# Patient Record
Sex: Female | Born: 1945 | Race: White | Hispanic: No | Smoking: Never smoker
Health system: Southern US, Community
[De-identification: ages and names within clinical notes are randomized; demographics above are authoritative.]

## PROBLEM LIST (undated history)

## (undated) DIAGNOSIS — F329 Major depressive disorder, single episode, unspecified: Secondary | ICD-10-CM

## (undated) DIAGNOSIS — I4719 Other supraventricular tachycardia: Secondary | ICD-10-CM

## (undated) DIAGNOSIS — I471 Supraventricular tachycardia, unspecified: Secondary | ICD-10-CM

## (undated) DIAGNOSIS — E782 Mixed hyperlipidemia: Secondary | ICD-10-CM

## (undated) DIAGNOSIS — E039 Hypothyroidism, unspecified: Secondary | ICD-10-CM

## (undated) DIAGNOSIS — I4891 Unspecified atrial fibrillation: Secondary | ICD-10-CM

## (undated) DIAGNOSIS — F32A Depression, unspecified: Secondary | ICD-10-CM

## (undated) DIAGNOSIS — C50919 Malignant neoplasm of unspecified site of unspecified female breast: Secondary | ICD-10-CM

## (undated) DIAGNOSIS — I10 Essential (primary) hypertension: Secondary | ICD-10-CM

## (undated) DIAGNOSIS — E119 Type 2 diabetes mellitus without complications: Secondary | ICD-10-CM

## (undated) HISTORY — DX: Supraventricular tachycardia, unspecified: I47.10

## (undated) HISTORY — DX: Essential (primary) hypertension: I10

## (undated) HISTORY — DX: Depression, unspecified: F32.A

## (undated) HISTORY — DX: Supraventricular tachycardia: I47.1

## (undated) HISTORY — DX: Hypothyroidism, unspecified: E03.9

## (undated) HISTORY — DX: Major depressive disorder, single episode, unspecified: F32.9

## (undated) HISTORY — DX: Unspecified atrial fibrillation: I48.91

## (undated) HISTORY — PX: TONSILLECTOMY: SUR1361

## (undated) HISTORY — DX: Other supraventricular tachycardia: I47.19

## (undated) HISTORY — DX: Mixed hyperlipidemia: E78.2

## (undated) HISTORY — DX: Malignant neoplasm of unspecified site of unspecified female breast: C50.919

---

## 2004-12-09 HISTORY — PX: OTHER SURGICAL HISTORY: SHX169

## 2005-10-11 ENCOUNTER — Ambulatory Visit: Admission: RE | Admit: 2005-10-11 | Discharge: 2005-10-24 | Payer: Self-pay | Admitting: *Deleted

## 2005-11-26 ENCOUNTER — Ambulatory Visit: Admission: RE | Admit: 2005-11-26 | Discharge: 2005-12-05 | Payer: Self-pay | Admitting: *Deleted

## 2009-10-26 ENCOUNTER — Ambulatory Visit (HOSPITAL_COMMUNITY): Admission: RE | Admit: 2009-10-26 | Discharge: 2009-10-26 | Payer: Self-pay | Admitting: Ophthalmology

## 2009-11-23 ENCOUNTER — Ambulatory Visit (HOSPITAL_COMMUNITY): Admission: RE | Admit: 2009-11-23 | Discharge: 2009-11-23 | Payer: Self-pay | Admitting: Ophthalmology

## 2010-04-19 ENCOUNTER — Ambulatory Visit: Payer: Self-pay | Admitting: Cardiology

## 2011-03-13 LAB — BASIC METABOLIC PANEL
CO2: 31 mEq/L (ref 19–32)
Chloride: 104 mEq/L (ref 96–112)
GFR calc Af Amer: >60 mL/min (ref >60)
Potassium: 3.6 mEq/L (ref 3.5–5.1)
Sodium: 142 mEq/L (ref 135–145)

## 2011-03-13 LAB — HEMOGLOBIN AND HEMATOCRIT, BLOOD
HCT: 34.3 % — ABNORMAL LOW (ref 36.0–46.0)
Hemoglobin: 11.8 g/dL — ABNORMAL LOW (ref 12.0–15.0)

## 2011-04-28 DIAGNOSIS — I471 Supraventricular tachycardia, unspecified: Secondary | ICD-10-CM

## 2011-04-28 DIAGNOSIS — R000 Tachycardia, unspecified: Secondary | ICD-10-CM

## 2011-04-29 DIAGNOSIS — I214 Non-ST elevation (NSTEMI) myocardial infarction: Secondary | ICD-10-CM

## 2011-04-29 DIAGNOSIS — I471 Supraventricular tachycardia: Secondary | ICD-10-CM

## 2011-04-29 DIAGNOSIS — I517 Cardiomegaly: Secondary | ICD-10-CM

## 2011-04-30 DIAGNOSIS — I4891 Unspecified atrial fibrillation: Secondary | ICD-10-CM

## 2011-05-01 ENCOUNTER — Encounter: Payer: Self-pay | Admitting: Cardiology

## 2011-05-02 ENCOUNTER — Encounter: Payer: Self-pay | Admitting: Internal Medicine

## 2011-05-02 ENCOUNTER — Ambulatory Visit (INDEPENDENT_AMBULATORY_CARE_PROVIDER_SITE_OTHER): Payer: Medicare Other | Admitting: Internal Medicine

## 2011-05-02 DIAGNOSIS — I471 Supraventricular tachycardia: Secondary | ICD-10-CM

## 2011-05-02 DIAGNOSIS — Z01810 Encounter for preprocedural cardiovascular examination: Secondary | ICD-10-CM

## 2011-05-02 DIAGNOSIS — R0602 Shortness of breath: Secondary | ICD-10-CM

## 2011-05-02 DIAGNOSIS — I498 Other specified cardiac arrhythmias: Secondary | ICD-10-CM

## 2011-05-02 LAB — PROTIME-INR

## 2011-05-02 NOTE — Assessment & Plan Note (Signed)
Diane Woods is a pleasant 65 yo WF with a h/o longstanding intermittent symptomatic SVT.  She has failed medical therapy with atenolol.  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  Therapeutic strategies for supraventricular tachycardia including medicine and ablation were discussed in detail with the patient today. Risk, benefits, and alternatives to EP study and radiofrequency ablation were also discussed in detail today. These risks include but are not limited to stroke, bleeding, vascular damage, tamponade, perforation, damage to the heart and other structures, AV block requiring pacemaker, worsening renal function, and death. The patient understands these risk and wishes to proceed.  We will therefore proceed with catheter ablation next week at the next available time.

## 2011-05-02 NOTE — Progress Notes (Signed)
Diane Woods is a pleasant 65 y.o. WF patient with a h/o supraventricular tachycardia who presents today for EP consultation.  She reports initially developing abrupt onset and abrupt offset of tachypalpitations more than 25 years ago.  She reports associated fatigue and decreased exercise tolerance.  She denies CP, SOB, presyncope, or syncope.  She feels that stress is the only identifiable trigger.  She states that episodes would initially occur every few years, lasting several minutes to several hours at a time. She has previously been able to terminate tachycardia with cough but has not found other vagal maneuvers to be helpful. Episodes have increased in frequency and duration since that time.  She reports having two episodes of tachycardia over the past few weeks.  The most recent episode occurred 04/27/11 lasting about 7 hours.  She presented to [facility] where she was documented to have a short RP narrow complex tachycardia at 155 bpm.  She received diltiazem without relief and subsequently amiodarone.  I do not see in the record that adenosine was administered.  Her tachycardia eventually terminated.  She had previously been taking atenolol for prevention.  This was stopped and she was placed on diltiazem 120mg  daily with instructions to be evaluated by EP for possible ablation.  Today, she denies symptoms of  chest pain, shortness of breath, orthopnea, PND, lower extremity edema, dizziness, presyncope, syncope, or neurologic sequela. The patient is tolerating medications without difficulties and is otherwise without complaint today.   Past Medical History  Diagnosis Date  . SVT (supraventricular tachycardia)   . Unspecified hypothyroidism     on Synthroid  . Hyperlipidemia   . Hypertension   . Personal history of malignant neoplasm of breast   . Depression     On Wellbutrin   Past Surgical History  Procedure Date  . Tonsillectomy   . Right breast lumpectomy 2006    Current  Outpatient Prescriptions  Medication Sig Dispense Refill  . ALPRAZolam (XANAX) 0.5 MG tablet Take 0.5 mg by mouth at bedtime as needed.        Marland Kitchen aspirin 81 MG tablet Take 81 mg by mouth daily.        Marland Kitchen azelastine (ASTELIN) 137 MCG/SPRAY nasal spray 1 spray by Nasal route 2 (two) times daily. Use in each nostril as directed       . buPROPion (WELLBUTRIN XL) 150 MG 24 hr tablet Take 150 mg by mouth daily.        . diclofenac (VOLTAREN) 75 MG EC tablet Take 75 mg by mouth 2 (two) times daily.        . fenofibrate (TRICOR) 145 MG tablet Take 145 mg by mouth daily.        . fish oil-omega-3 fatty acids 1000 MG capsule Take 1,200 mg by mouth daily. Take 4 tablets by mouth daily.       Marland Kitchen FLUoxetine (PROZAC) 40 MG capsule Take 40 mg by mouth daily.        Marland Kitchen levothyroxine (SYNTHROID, LEVOTHROID) 88 MCG tablet Take 88 mcg by mouth daily.        Marland Kitchen loratadine (CLARITIN) 10 MG tablet Take 10 mg by mouth daily.        . metFORMIN (GLUCOPHAGE) 500 MG tablet Take 500 mg by mouth. Take 1 tablet by mouth every day.      . Multiple Vitamins-Minerals (MULTI COMPLETE PO) Take by mouth.        . oxyCODONE-acetaminophen (PERCOCET) 10-325 MG per tablet Take 1 tablet  by mouth every 4 (four) hours as needed.        . potassium chloride (K-DUR) 10 MEQ tablet Take 20 mEq by mouth 2 (two) times daily.        . rosuvastatin (CRESTOR) 10 MG tablet Take 10 mg by mouth daily.        Marland Kitchen zolpidem (AMBIEN) 10 MG tablet Take 10 mg by mouth at bedtime as needed.        Marland Kitchen azithromycin (ZITHROMAX) 250 MG tablet Take 2 tablets by mouth on day 1, followed by 1 tablet by mouth daily for 4 days.      Marland Kitchen diltiazem (CARDIZEM CD) 120 MG 24 hr capsule       . potassium chloride SA (K-DUR,KLOR-CON) 20 MEQ tablet         Allergies  Allergen Reactions  . Penicillins Swelling    THROAT SWELLING  . Sulfa Antibiotics Rash    History   Social History  . Marital Status: Married    Spouse Name: N/A    Number of Children: N/A  . Years of  Education: N/A   Occupational History  . Not on file.   Social History Main Topics  . Smoking status: Never Smoker   . Smokeless tobacco: Not on file  . Alcohol Use: No  . Drug Use: No  . Sexually Active: Not on file   Other Topics Concern  . Not on file   Social History Narrative   Pt lives in [place].  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     Family History  Problem Relation Age of Onset  . Coronary artery disease      REMOTE FAMILY HISTORY OF EARLY    ROS- All systems are reviewed and negative except as per the HPI above  Physical Exam: Filed Vitals:   05/02/11 1019  BP: 117/73  Pulse: 64  Resp: 20  Height: 5\' 2"  (1.575 m)  Weight: 159 lb 12.8 oz (72.485 kg)  SpO2: 98%    GEN- The patient is well appearing, alert and oriented x 3 today.   Head- normocephalic, atraumatic Eyes-  Sclera clear, conjunctiva pink Ears- hearing intact Oropharynx- clear Neck- supple, no JVP Lymph- no cervical lymphadenopathy Lungs- Clear to ausculation bilaterally, normal work of breathing Heart- Regular rate and rhythm, no murmurs, rubs or gallops, PMI not laterally displaced GI- soft, NT, ND, + BS Extremities- no clubbing, cyanosis, or edema MS- no significant deformity or atrophy Skin- no rash or lesion Psych- euthymic mood, full affect Neuro- strength and sensation are intact  EKG from 04/27/11 reveals a narrow complex SVT at 155 bpm with pseudo RsR' in V1 and pseudo S wave in II EKG from 04/28/11 reveals sinus bradycardia 57 bpm, PR 130, QRS 92, Qtc 431, LAD, otherwise normal ekg  Echo reveals preserved EF with mild pulmonary hypertension  Assessment and Plan:

## 2011-05-02 NOTE — Patient Instructions (Signed)
   Please arrive to short stay at [facility] on Thursday, May 31 at 1:00 Your physician recommends that you go to the [facility] for lab work today Follow up to be determined.

## 2011-05-03 ENCOUNTER — Encounter: Payer: Self-pay | Admitting: Internal Medicine

## 2011-05-09 ENCOUNTER — Ambulatory Visit (HOSPITAL_COMMUNITY)
Admission: RE | Admit: 2011-05-09 | Discharge: 2011-05-09 | Disposition: A | Discharge status: Home / Self Care | Payer: Medicare Other | Source: Ambulatory Visit | Attending: Internal Medicine | Admitting: Internal Medicine

## 2011-05-09 DIAGNOSIS — I471 Supraventricular tachycardia: Secondary | ICD-10-CM

## 2011-05-09 DIAGNOSIS — I4891 Unspecified atrial fibrillation: Secondary | ICD-10-CM | POA: Insufficient documentation

## 2011-05-09 DIAGNOSIS — I498 Other specified cardiac arrhythmias: Secondary | ICD-10-CM | POA: Insufficient documentation

## 2011-05-09 LAB — GLUCOSE, CAPILLARY

## 2011-05-14 ENCOUNTER — Encounter (HOSPITAL_COMMUNITY): Payer: Medicare Other | Admitting: Radiology

## 2011-05-20 NOTE — Op Note (Signed)
NAME:  Diane Woods                ACCOUNT NO.:  [id number]  MEDICAL RECORD NO.:  [id number]           PATIENT TYPE:  O  LOCATION:  [facility]                         FACILITY:  [facility]  PHYSICIAN:  Hillis Range, MD       DATE OF BIRTH:  [birth year]  DATE OF PROCEDURE:  05/09/2011 DATE OF DISCHARGE:  05/09/2011                              OPERATIVE REPORT   PREPROCEDURE DIAGNOSIS:  Supraventricular tachycardia.  POSTPROCEDURE DIAGNOSIS:  Multifocal atrial tachycardia and atrial fibrillation.  PROCEDURES: 1. Comprehensive EP study. 2. Coronary sinus pacing and recording. 3. Mapping of SVT. 4. Isoproterenol infusion. 5. External cardioversion.  INTRODUCTION:  Diane Woods is a pleasant 65 year old female with symptomatic palpitations and previously documented tachycardia who presents today for EP study and radiofrequency ablation.  She reports abrupt onset of tachypalpitations for more than 25 years.  She recently presented to [facility] and was found to have a narrow complex tachycardia, which required intravenous diltiazem and subsequently amiodarone to terminate.  The tachycardia appeared to be a short RP tachycardia, which was quite regular.  She therefore presents today for EP study and radiofrequency ablation.  DESCRIPTION OF PROCEDURE:  Informed written consent was obtained and the patient was brought to the electrophysiology lab in the fasting state. She was adequately sedated with intravenous Versed as outlined in the nursing report.  The patient's right neck and groin were prepped and draped in the usual sterile fashion by the EP lab staff.  Using a percutaneous Seldinger technique, one 6-French hemostasis sheath was placed in the right internal jugular vein.  A 6-French curved Damato catheter was introduced through the right internal jugular vein and advanced into the coronary sinus for recording and pacing from this location.  Two 6-French and one 8-French  hemostasis sheaths were placed in the right common femoral vein.  Two 6-French quadripolar Josephson catheters were introduced through the right common femoral vein and advanced into the His bundle and right ventricular apex positions respectively.  The patient presented to the electrophysiology lab in normal sinus rhythm.  Her PR interval was 146 milliseconds with a QRS duration of 100 milliseconds and a QT interval of 390 milliseconds.  Her AH interval measured 54 milliseconds with an HV interval of 39 milliseconds.  Ventricular pacing was performed, which revealed midline decremental VA conduction with no arrhythmias induced.  The retrograde Wenckebach cycle length was 370 milliseconds.  Ventricular extra stimulus testing was then performed, which revealed midline decremental VA conduction with no retrograde jumps, echo beats, or arrhythmias observed.  The ventricular ERP was 500/250 milliseconds.  Atrial pacing was performed, which revealed decremental AV conduction with PR equal to, but not greater than RR.  The patient was observed to have a nonsustained one-to-one tachycardia with a cycle length of 446 milliseconds.  The VA time measured 315 milliseconds.  The tachycardia terminated very quickly with a ventricular beat.  The AV Wenckebach cycle length was confirmed to be 390 milliseconds.  Atrial extra stimulus testing was performed, which revealed decremental AV conduction with no AH jumps or echo beats.  The patient had again inducible tachycardia.  This  appeared to be the same one-to-one tachycardia with proximal-to-distal coronary sinus activation and the tachycardia cycle length now of 418 milliseconds with a VA time of 235 milliseconds. After several beats, this tachycardia spontaneously terminated with a ventricular beat.  No pacing maneuvers could be performed during tachycardia due to its brevity.  Atrial extra stimulus testing was again performed, which again confirmed no  AH jumps, echo beats, and had no tachycardias were induced.  The atrial ERP was 500/260 milliseconds. Atrial extra stimulus testing was then performed with a basic cycle length of 450 milliseconds with decremental AV conduction with no AH jumps or echo beats.  The patient again had tachycardia induced with proximal-to-distal atrial activation with a cycle length of 434 milliseconds and a VA time of 215 milliseconds.  Again after several beats, this tachycardia terminated with a ventricular beat.  Again, no pacing maneuvers could be performed during tachycardia because of its brevity.  Atrial extra stimulus testing was again performed at a basic cycle length of 500 milliseconds down to 500/270, which was the atrial ERP.  Isoproterenol was then infused at 2 mcg/min and adequate acceleration in heart rate response was observed.  Atrial pacing was performed down to a cycle length of 260 milliseconds, which was the AV Wenckebach cycle length with no evidence of PR greater than RR and no tachycardias observed.  Atrial extra stimulus testing was performed during isoproterenol infusion at 2 mcg/min with a basic cycle length of 450 milliseconds down to the atrial ERP, which was 450/230 milliseconds. Isoproterenol was decreased to 1 mcg per minute and ventricular pacing was performed, which again confirmed decremental VA conduction with ventricular pacing at 300 milliseconds.  Tachycardia was induced.  This was a proximal-to-distal tachycardia which was one-to-one with an atrial cycle length of 330 milliseconds and a VA time of 250 milliseconds. There was no wobble during this tachycardia cycle length and the tachycardia ended with a ventricular beat after several beats and due to its brevity, pacing maneuvers could not be performed during tachycardia. This was not reproducible response.  Ventricular extra stimulus testing was performed with a basic cycle length of 450 milliseconds, which revealed  midline decremental VA conduction with no retrograde jumps, echo beats, or tachycardias.  The ventricular ERP was 450/240 milliseconds.  Atrial pacing was performed during isoproterenol infusion down to a cycle length of 310 milliseconds, which was the AV Wenckebach cycle length with no arrhythmias observed.  There was no evidence of PR greater than RR.  Spontaneously, the patient then developed an initially one-to-one tachycardia with a cycle length of 290 milliseconds with proximal-to-distal coronary sinus activation and a variable VA time. The tachycardia then changed morphology such that the coronary sinus activation and the cycle length became variable.  This was felt to represent atrial tachycardia, which degenerated into initially atrial fibrillation and subsequently multifocal atrial tachycardia.  The patient had intermittent atrial fibrillation and intermittent multifocal atrial tachycardia.  Quite interestingly, the ventricular cycle length was rather regular and narrow at 157 beats per minute.  The patient was observed and 5 mg of intravenous Lopressor were administered.  With Lopressor, 2:1 AV conduction was observed.  The average cycle length of the atrial rhythm appear to be 184 milliseconds, however, atrial activations were of variable cycle lengths and variable morphologies with 2:1 AV conduction at this point.  The patient then degenerated into atrial fibrillation.  Once the tachycardia did not terminate after several minutes, the patient was successfully cardioverted to sinus rhythm with a  single 360 joules biphasic shock with cardioversion electrodes in the anterior-posterior thoracic configuration.  She remained in sinus rhythm thereafter.  Due to the fact that the patient had only a multifocal atrial tachycardia induced which was not amenable to mapping or ablation, no ablation was performed today.  She had no evidence of accessory pathways or dual AV nodal physiology.   I think the medical therapies will therefore be required long-term.  The procedure was therefore considered completed.  All catheters were removed and the sheaths were aspirated and flushed.  The sheaths were removed and hemostasis was assured.  There were no early apparent complications.  CONCLUSIONS: 1. Sinus rhythm upon presentation. 2. No evidence of accessory pathways or dual AV nodal physiology.  The     patient did have a nonsustained atrial tachycardia, however, this     was a long RP tachycardia and not consistent with her clinical     arrhythmia. 3. With isoproterenol infusion, the patient had multifocal atrial     tachycardia, which degenerated into atrial fibrillation.  During     her multifocal atrial tachycardia, the RR intervals were quite     regular and were felt to be more consistent with a clinical     arrhythmia.  It is felt that she therefore has multifocal atrial     tachycardia as well as atrial fibrillation as her underlying     clinical process.  The arrhythmia was felt to not be amenable to     catheter ablation today and therefore no ablation was performed. 4. Successful cardioversion to sinus rhythm. 5. Early apparent complications.  PLAN:  The patient will be started on atenolol 50 mg daily as this arrhythmia was induced with isoproterenol, and I will discontinue diltiazem.  I will place the patient on flecainide 50 mg twice daily and bring her back in several weeks for a GXT Myoview to exclude flecainide- induced arrhythmias or ischemia.     Hillis Range, MD     JA/MEDQ  D:  05/09/2011  T:  05/10/2011  Job:  [id number]  cc:   Jonelle Sidle, MD  Electronically Signed by Hillis Range MD on 05/20/2011 09:19:37 AM

## 2011-05-30 ENCOUNTER — Ambulatory Visit (HOSPITAL_COMMUNITY): Payer: Medicare Other | Attending: Internal Medicine | Admitting: Radiology

## 2011-05-30 DIAGNOSIS — I498 Other specified cardiac arrhythmias: Secondary | ICD-10-CM | POA: Insufficient documentation

## 2011-05-30 DIAGNOSIS — R0989 Other specified symptoms and signs involving the circulatory and respiratory systems: Secondary | ICD-10-CM

## 2011-05-30 DIAGNOSIS — E119 Type 2 diabetes mellitus without complications: Secondary | ICD-10-CM | POA: Insufficient documentation

## 2011-05-30 DIAGNOSIS — I4891 Unspecified atrial fibrillation: Secondary | ICD-10-CM

## 2011-05-30 MED ORDER — REGADENOSON 0.4 MG/5ML IV SOLN
0.4000 mg | Freq: Once | INTRAVENOUS | Status: AC
  Administered 2011-05-30: 0.4 mg via INTRAVENOUS

## 2011-05-30 MED ORDER — TECHNETIUM TC 99M TETROFOSMIN IV KIT
11.0000 | PACK | Freq: Once | INTRAVENOUS | Status: AC | PRN
  Administered 2011-05-30: 11 via INTRAVENOUS

## 2011-05-30 MED ORDER — TECHNETIUM TC 99M TETROFOSMIN IV KIT
33.0000 | PACK | Freq: Once | INTRAVENOUS | Status: AC | PRN
  Administered 2011-05-30: 33 via INTRAVENOUS

## 2011-05-30 NOTE — Progress Notes (Signed)
[facility] SITE 3 NUCLEAR MED [address] [place] [address] [phone]  Cardiology Nuclear Med Study  Diane Woods is a 65 y.o. female [id number] [birth year]   Nuclear Med Background Indication for Stress Test:  Evaluation for Ischemia History:  5/11 MPS([place])OK per patient, 05/10/11 Cardioversion, H/O SVT/Afib. Cardiac Risk Factors: Family History - CAD, Hypertension, Lipids and NIDDM  Symptoms:  DOE, Fatigue, Palpitations and Rapid HR   Nuclear Pre-Procedure Caffeine/Decaff Intake:  None NPO After: 12:00am   Lungs:  Clear. IV 0.9% NS with Angio Cath:  20g  IV Site: L Forearm  IV Started by:  Stanton Kidney, EMT-P  Chest Size (in):  36 Cup Size: D  Height: 5\' 2"  (1.575 m)  Weight:  151 lb (68.493 kg)  BMI:  Body mass index is 27.62 kg/(m^2). Tech Comments:  Atenolol held > 24 hours, per patient. CBG = 94 @ 7:15 am, per patient.  S/P (R) Lumpectomy with radiation.    Nuclear Med Study 1 or 2 day study: 1 day  Stress Test Type:  Treadmill/Lexiscan  Reading MD: Charlton Haws, MD  Order Authorizing Provider:  Hillis Range, MD  Resting Radionuclide: Technetium 41m Tetrofosmin  Resting Radionuclide Dose: 11 mCi   Stress Radionuclide:  Technetium 59m Tetrofosmin  Stress Radionuclide Dose: 33 mCi           Stress Protocol Rest HR: 54 Stress HR: 97  Rest BP: 135/88 Stress BP: 162/72  Exercise Time (min): 6:01 METS: 4.7   Predicted Max HR: 155 bpm % Max HR: 62.58 bpm Rate Pressure Product: 36644   Dose of Adenosine (mg):  n/a Dose of Lexiscan: 0.4 mg  Dose of Atropine (mg): n/a Dose of Dobutamine: n/a mcg/kg/min (at max HR)  Stress Test Technologist: Smiley Houseman, CMA-N  Nuclear Technologist:  Domenic Polite, CNMT     Rest Procedure:  Myocardial perfusion imaging was performed at rest 45 minutes following the intravenous administration of Technetium 24m Tetrofosmin.  Rest ECG: Sinus bradycardia.  Stress Procedure:  The patient initially  walked the treadmill for 4:00 utilizing the Bruce protocol, but was unable to obtain her target heart rate.  She was then given IV Lexiscan 0.4 mg over 15-seconds with concurrent low level exercise and then Technetium 78m Tetrofosmin was injected at 30-seconds while the patient continued walking one more minute.  There were nonspecific ST-T wave changes with Lexiscan.  Quantitative spect images were obtained after a 45-minute delay.  Stress ECG: No significant change from baseline ECG  QPS Raw Data Images:  Normal; no motion artifact; normal heart/lung ratio. Stress Images:  Normal homogeneous uptake in all areas of the myocardium. Rest Images:  Normal homogeneous uptake in all areas of the myocardium. Subtraction (SDS):  Normal Transient Ischemic Dilatation (Normal <1.22):  .96 Lung/Heart Ratio (Normal <0.45):  .34  Quantitative Gated Spect Images QGS EDV:  74 ml QGS ESV:  19 ml QGS cine images:  NL LV Function; NL Wall Motion QGS EF: 75%  Impression Exercise Capacity:  Lexiscan with low level exercise. BP Response:  Normal blood pressure response. Clinical Symptoms:  No chest pain. ECG Impression:  No significant ST segment change suggestive of ischemia. Comparison with Prior Nuclear Study: No images to compare  Overall Impression:  Normal stress nuclear study.  Charlton Haws

## 2011-05-31 NOTE — Progress Notes (Signed)
Nuclear report for review Keora Eccleston H  

## 2011-06-07 NOTE — Progress Notes (Signed)
lmom for pt

## 2011-06-14 ENCOUNTER — Encounter: Payer: Medicare Other | Admitting: Cardiology

## 2011-07-09 ENCOUNTER — Ambulatory Visit (INDEPENDENT_AMBULATORY_CARE_PROVIDER_SITE_OTHER): Payer: Medicare Other | Admitting: Cardiology

## 2011-07-09 ENCOUNTER — Encounter: Payer: Self-pay | Admitting: *Deleted

## 2011-07-09 ENCOUNTER — Encounter: Payer: Self-pay | Admitting: Cardiology

## 2011-07-09 DIAGNOSIS — E119 Type 2 diabetes mellitus without complications: Secondary | ICD-10-CM | POA: Insufficient documentation

## 2011-07-09 DIAGNOSIS — I10 Essential (primary) hypertension: Secondary | ICD-10-CM | POA: Insufficient documentation

## 2011-07-09 DIAGNOSIS — E782 Mixed hyperlipidemia: Secondary | ICD-10-CM

## 2011-07-09 DIAGNOSIS — I4891 Unspecified atrial fibrillation: Secondary | ICD-10-CM

## 2011-07-09 DIAGNOSIS — I471 Supraventricular tachycardia: Secondary | ICD-10-CM

## 2011-07-09 MED ORDER — FLECAINIDE ACETATE 50 MG PO TABS: 50.0000 mg | ORAL_TABLET | Freq: Two times a day (BID) | ORAL | Status: DC

## 2011-07-09 MED ORDER — ATENOLOL 50 MG PO TABS: 50.0000 mg | ORAL_TABLET | Freq: Every day | ORAL | Status: DC

## 2011-07-09 NOTE — Assessment & Plan Note (Signed)
Noted at EP study, degenerated from MAT. See above discussion.

## 2011-07-09 NOTE — Patient Instructions (Addendum)
Follow up as scheduled with Drs. McDowell and Allred. Your physician recommends that you continue on your current medications as directed. Please refer to the Current Medication list given to you today. Your physician has requested that you have an exercise tolerance test. For further information please visit [email or web address] Please also follow instruction sheet, as given.

## 2011-07-09 NOTE — Assessment & Plan Note (Signed)
Not specifically mentioned in her history that I unable to locate in the medical record, however she is on metformin.

## 2011-07-09 NOTE — Progress Notes (Signed)
Clinical Summary Diane Woods is a 65 y.o.female presenting for office followup. This is my first meeting with her. She has been seen in the past by Dr. Andee Lineman, and most recently by Dr. Johney Frame in the setting of recurrent SVT. EP study in June did not show evidence of dual AV nodal physiology, however she did have evidence of multifocal atrial tachycardia which degenerated into atrial fibrillation, was successfully cardioverted, and has been treated with beta blocker therapy as well as Flecainide.  She underwent a Lexiscan Myoview on June 22 which was normal.  Clinically, she reports feeling well, no significant palpitations or chest pain. I reviewed her medications. She has not yet had a followup GXT, or further review with Dr. Johney Frame.   Allergies  Allergen Reactions  . Penicillins Swelling    THROAT SWELLING  . Sulfa Antibiotics Rash    Medication list reviewed.  Past Medical History  Diagnosis Date  . Hypothyroidism   . Mixed hyperlipidemia   . Essential hypertension, benign   . Breast cancer   . Depression   . Multifocal atrial tachycardia   . Atrial fibrillation     Past Surgical History  Procedure Date  . Tonsillectomy   . Right breast lumpectomy 2006    Family History  Problem Relation Age of Onset  . Coronary artery disease      Social History Ms. Marmo reports that she has never smoked. She has never used smokeless tobacco. Ms. Melikian reports that she does not drink alcohol.  Review of Systems Reports tolerating her medications. Otherwise negative.  Physical Examination Filed Vitals:   07/09/11 0809  BP: 147/94  Pulse: 56  Resp: 18  Overweight woman in no acute distress. HEENT: Conjunctiva and lids normal, oropharynx with moist mucosa. Neck: Supple, no elevated JVP or carotid bruits. Lungs: Clear to auscultation, nonlabored. Cardiac: Regular rate and rhythm, no S3 gallop or rub. Abdomen: Soft, nontender, bowel sounds present. Skin: Warm and dry, no  unusual bruising. Extremities: No pitting edema, distal pulses full. Neuropsychiatric: Alert and oriented x3, affect appropriate.   ECG Sinus bradycardia at 59 beats per minutes with QTC 437 ms.   Problem List and Plan

## 2011-07-09 NOTE — Assessment & Plan Note (Signed)
Followed by Dr. Olena Leatherwood. Blood pressure is up today. Discussed sodium restriction.

## 2011-07-09 NOTE — Assessment & Plan Note (Signed)
Noted on EP study with Dr. Johney Frame, no clear evidence of dual AV nodal physiology, and degenerated into atrial fibrillation which was converted. She is now on flecainide, beta blocker, and aspirin. QTC is normal. Plan is to arrange a GXT on medical therapy to exclude proarrhythmia and have the patient follow up with Dr. Johney Frame. Not certain if there was a plan for Coumadin or not since her principle rhythm was not necessarily atrial fibrillation. She does have a CHADS2 score of 2 and a CHADSVASC score of 4, however.

## 2011-07-30 ENCOUNTER — Telehealth: Payer: Self-pay | Admitting: Internal Medicine

## 2011-07-30 NOTE — Telephone Encounter (Signed)
Patient never had GXT and is cx apt with Dr Johney Frame for Fri  I explained to her how important both apt's are and to try and get the GXT soon and resch apt with Allred after  I even offered to let her keep apt Fri with  Allred  and get GXT after  She refused and will  let me know if and when she reschedules

## 2011-07-30 NOTE — Telephone Encounter (Signed)
Pt called to cxl appt with allred on Friday due to not having a stress test, she said he won't want to see her without it, but dr mcdowell's office requested she be seen sooner than her original appt in sept due to her a-fib, she says she doesn't think stress test will be accurate due to problem with her legs, not sure what to do, I didn't cancel the appt yet though

## 2011-08-01 ENCOUNTER — Telehealth: Payer: Self-pay | Admitting: *Deleted

## 2011-08-01 NOTE — Telephone Encounter (Signed)
Message copied by Arlyss Gandy on Thu Aug 01, 2011  8:27 AM ------      Message from: Zachary George T      Created: Wed Jul 31, 2011  4:46 PM      Regarding: RE: gxt       Left message on patient's voice mail to return call.      Received a telephone call from Mr. Decelle stating that      Desteny Freeman is out of town on vacation. She will call the      Office when she returns from vacation      ----- Message -----         From: Cyril Loosen, RN         Sent: 07/31/2011   3:12 PM           To: Megan Salon      Subject: gxt                                                      Pt suppose to have GXT done and then f/u with Dr. Johney Frame. Pt cx'd Allred appt (need to r/s) until after GXT done. Please contact pt to reschedule.             Thanks

## 2011-08-02 ENCOUNTER — Ambulatory Visit: Payer: Medicare Other | Admitting: Internal Medicine

## 2011-08-19 ENCOUNTER — Ambulatory Visit: Payer: Medicare Other | Admitting: Internal Medicine

## 2011-09-27 ENCOUNTER — Encounter: Payer: Self-pay | Admitting: Cardiology

## 2011-10-07 ENCOUNTER — Ambulatory Visit: Payer: Medicare Other | Admitting: Cardiology

## 2012-07-27 DIAGNOSIS — C50919 Malignant neoplasm of unspecified site of unspecified female breast: Secondary | ICD-10-CM

## 2015-12-12 DIAGNOSIS — M545 Low back pain: Secondary | ICD-10-CM | POA: Diagnosis not present

## 2015-12-12 DIAGNOSIS — I10 Essential (primary) hypertension: Secondary | ICD-10-CM | POA: Diagnosis not present

## 2015-12-20 DIAGNOSIS — Z79899 Other long term (current) drug therapy: Secondary | ICD-10-CM | POA: Diagnosis not present

## 2015-12-20 DIAGNOSIS — I10 Essential (primary) hypertension: Secondary | ICD-10-CM | POA: Diagnosis not present

## 2015-12-20 DIAGNOSIS — E119 Type 2 diabetes mellitus without complications: Secondary | ICD-10-CM | POA: Diagnosis not present

## 2016-03-14 DIAGNOSIS — I10 Essential (primary) hypertension: Secondary | ICD-10-CM | POA: Diagnosis not present

## 2016-03-14 DIAGNOSIS — M545 Low back pain: Secondary | ICD-10-CM | POA: Diagnosis not present

## 2016-04-03 DIAGNOSIS — Z1211 Encounter for screening for malignant neoplasm of colon: Secondary | ICD-10-CM | POA: Diagnosis not present

## 2016-06-18 DIAGNOSIS — I10 Essential (primary) hypertension: Secondary | ICD-10-CM | POA: Diagnosis not present

## 2016-06-18 DIAGNOSIS — R5383 Other fatigue: Secondary | ICD-10-CM | POA: Diagnosis not present

## 2016-06-18 DIAGNOSIS — M545 Low back pain: Secondary | ICD-10-CM | POA: Diagnosis not present

## 2016-06-19 DIAGNOSIS — E119 Type 2 diabetes mellitus without complications: Secondary | ICD-10-CM | POA: Diagnosis not present

## 2016-06-19 DIAGNOSIS — R5383 Other fatigue: Secondary | ICD-10-CM | POA: Diagnosis not present

## 2016-06-19 DIAGNOSIS — I10 Essential (primary) hypertension: Secondary | ICD-10-CM | POA: Diagnosis not present

## 2016-09-12 DIAGNOSIS — M545 Low back pain: Secondary | ICD-10-CM | POA: Diagnosis not present

## 2016-09-12 DIAGNOSIS — Z0000 Encounter for general adult medical examination without abnormal findings: Secondary | ICD-10-CM | POA: Diagnosis not present

## 2016-09-12 DIAGNOSIS — I10 Essential (primary) hypertension: Secondary | ICD-10-CM | POA: Diagnosis not present

## 2016-11-07 DIAGNOSIS — Z1231 Encounter for screening mammogram for malignant neoplasm of breast: Secondary | ICD-10-CM | POA: Diagnosis not present

## 2016-11-07 DIAGNOSIS — R928 Other abnormal and inconclusive findings on diagnostic imaging of breast: Secondary | ICD-10-CM | POA: Diagnosis not present

## 2016-11-20 DIAGNOSIS — Z9889 Other specified postprocedural states: Secondary | ICD-10-CM | POA: Diagnosis not present

## 2016-11-20 DIAGNOSIS — R921 Mammographic calcification found on diagnostic imaging of breast: Secondary | ICD-10-CM | POA: Diagnosis not present

## 2016-12-03 DIAGNOSIS — I10 Essential (primary) hypertension: Secondary | ICD-10-CM | POA: Diagnosis not present

## 2016-12-03 DIAGNOSIS — E119 Type 2 diabetes mellitus without complications: Secondary | ICD-10-CM | POA: Diagnosis not present

## 2016-12-03 DIAGNOSIS — Z1389 Encounter for screening for other disorder: Secondary | ICD-10-CM | POA: Diagnosis not present

## 2016-12-03 DIAGNOSIS — M545 Low back pain: Secondary | ICD-10-CM | POA: Diagnosis not present

## 2016-12-03 DIAGNOSIS — Z0000 Encounter for general adult medical examination without abnormal findings: Secondary | ICD-10-CM | POA: Diagnosis not present

## 2016-12-24 DIAGNOSIS — J158 Pneumonia due to other specified bacteria: Secondary | ICD-10-CM | POA: Diagnosis not present

## 2017-02-26 DIAGNOSIS — I482 Chronic atrial fibrillation: Secondary | ICD-10-CM | POA: Diagnosis not present

## 2017-02-26 DIAGNOSIS — E038 Other specified hypothyroidism: Secondary | ICD-10-CM | POA: Diagnosis not present

## 2017-02-26 DIAGNOSIS — E784 Other hyperlipidemia: Secondary | ICD-10-CM | POA: Diagnosis not present

## 2017-02-26 DIAGNOSIS — Z0000 Encounter for general adult medical examination without abnormal findings: Secondary | ICD-10-CM | POA: Diagnosis not present

## 2017-03-17 DIAGNOSIS — E784 Other hyperlipidemia: Secondary | ICD-10-CM | POA: Diagnosis not present

## 2017-03-17 DIAGNOSIS — E039 Hypothyroidism, unspecified: Secondary | ICD-10-CM | POA: Diagnosis not present

## 2017-03-17 DIAGNOSIS — I482 Chronic atrial fibrillation: Secondary | ICD-10-CM | POA: Diagnosis not present

## 2017-03-31 DIAGNOSIS — Z780 Asymptomatic menopausal state: Secondary | ICD-10-CM | POA: Diagnosis not present

## 2017-03-31 DIAGNOSIS — M810 Age-related osteoporosis without current pathological fracture: Secondary | ICD-10-CM | POA: Diagnosis not present

## 2017-05-06 DIAGNOSIS — Z1211 Encounter for screening for malignant neoplasm of colon: Secondary | ICD-10-CM | POA: Diagnosis not present

## 2017-06-06 DIAGNOSIS — I482 Chronic atrial fibrillation: Secondary | ICD-10-CM | POA: Diagnosis not present

## 2017-06-06 DIAGNOSIS — E784 Other hyperlipidemia: Secondary | ICD-10-CM | POA: Diagnosis not present

## 2017-06-06 DIAGNOSIS — E038 Other specified hypothyroidism: Secondary | ICD-10-CM | POA: Diagnosis not present

## 2017-07-31 DIAGNOSIS — Z961 Presence of intraocular lens: Secondary | ICD-10-CM | POA: Diagnosis not present

## 2017-07-31 DIAGNOSIS — H26493 Other secondary cataract, bilateral: Secondary | ICD-10-CM | POA: Diagnosis not present

## 2017-07-31 DIAGNOSIS — E119 Type 2 diabetes mellitus without complications: Secondary | ICD-10-CM | POA: Diagnosis not present

## 2017-07-31 DIAGNOSIS — Z7984 Long term (current) use of oral hypoglycemic drugs: Secondary | ICD-10-CM | POA: Diagnosis not present

## 2017-08-07 DIAGNOSIS — Z1211 Encounter for screening for malignant neoplasm of colon: Secondary | ICD-10-CM | POA: Diagnosis not present

## 2017-08-07 DIAGNOSIS — R198 Other specified symptoms and signs involving the digestive system and abdomen: Secondary | ICD-10-CM | POA: Diagnosis not present

## 2017-09-05 DIAGNOSIS — Z0000 Encounter for general adult medical examination without abnormal findings: Secondary | ICD-10-CM | POA: Diagnosis not present

## 2017-09-05 DIAGNOSIS — I482 Chronic atrial fibrillation: Secondary | ICD-10-CM | POA: Diagnosis not present

## 2017-09-05 DIAGNOSIS — E784 Other hyperlipidemia: Secondary | ICD-10-CM | POA: Diagnosis not present

## 2017-09-05 DIAGNOSIS — Z1211 Encounter for screening for malignant neoplasm of colon: Secondary | ICD-10-CM | POA: Diagnosis not present

## 2017-09-05 DIAGNOSIS — E038 Other specified hypothyroidism: Secondary | ICD-10-CM | POA: Diagnosis not present

## 2017-09-05 DIAGNOSIS — I10 Essential (primary) hypertension: Secondary | ICD-10-CM | POA: Diagnosis not present

## 2017-09-05 DIAGNOSIS — Z1389 Encounter for screening for other disorder: Secondary | ICD-10-CM | POA: Diagnosis not present

## 2017-09-25 DIAGNOSIS — E038 Other specified hypothyroidism: Secondary | ICD-10-CM | POA: Diagnosis not present

## 2017-09-25 DIAGNOSIS — E119 Type 2 diabetes mellitus without complications: Secondary | ICD-10-CM | POA: Diagnosis not present

## 2017-09-25 DIAGNOSIS — E7849 Other hyperlipidemia: Secondary | ICD-10-CM | POA: Diagnosis not present

## 2017-10-13 DIAGNOSIS — H26492 Other secondary cataract, left eye: Secondary | ICD-10-CM | POA: Diagnosis not present

## 2017-12-04 DIAGNOSIS — I482 Chronic atrial fibrillation: Secondary | ICD-10-CM | POA: Diagnosis not present

## 2017-12-04 DIAGNOSIS — E119 Type 2 diabetes mellitus without complications: Secondary | ICD-10-CM | POA: Diagnosis not present

## 2017-12-04 DIAGNOSIS — I10 Essential (primary) hypertension: Secondary | ICD-10-CM | POA: Diagnosis not present

## 2017-12-04 DIAGNOSIS — E038 Other specified hypothyroidism: Secondary | ICD-10-CM | POA: Diagnosis not present

## 2017-12-04 DIAGNOSIS — E7849 Other hyperlipidemia: Secondary | ICD-10-CM | POA: Diagnosis not present

## 2018-03-09 DIAGNOSIS — E119 Type 2 diabetes mellitus without complications: Secondary | ICD-10-CM | POA: Diagnosis not present

## 2018-03-09 DIAGNOSIS — E7849 Other hyperlipidemia: Secondary | ICD-10-CM | POA: Diagnosis not present

## 2018-03-09 DIAGNOSIS — E038 Other specified hypothyroidism: Secondary | ICD-10-CM | POA: Diagnosis not present

## 2018-03-09 DIAGNOSIS — I10 Essential (primary) hypertension: Secondary | ICD-10-CM | POA: Diagnosis not present

## 2018-03-09 DIAGNOSIS — J301 Allergic rhinitis due to pollen: Secondary | ICD-10-CM | POA: Diagnosis not present

## 2018-06-17 DIAGNOSIS — Z0000 Encounter for general adult medical examination without abnormal findings: Secondary | ICD-10-CM | POA: Diagnosis not present

## 2018-06-17 DIAGNOSIS — E119 Type 2 diabetes mellitus without complications: Secondary | ICD-10-CM | POA: Diagnosis not present

## 2018-06-17 DIAGNOSIS — E7849 Other hyperlipidemia: Secondary | ICD-10-CM | POA: Diagnosis not present

## 2018-06-17 DIAGNOSIS — I10 Essential (primary) hypertension: Secondary | ICD-10-CM | POA: Diagnosis not present

## 2018-06-17 DIAGNOSIS — Z1389 Encounter for screening for other disorder: Secondary | ICD-10-CM | POA: Diagnosis not present

## 2018-06-17 DIAGNOSIS — E038 Other specified hypothyroidism: Secondary | ICD-10-CM | POA: Diagnosis not present

## 2018-06-26 DIAGNOSIS — Z0000 Encounter for general adult medical examination without abnormal findings: Secondary | ICD-10-CM | POA: Diagnosis not present

## 2018-06-26 DIAGNOSIS — E119 Type 2 diabetes mellitus without complications: Secondary | ICD-10-CM | POA: Diagnosis not present

## 2018-06-26 DIAGNOSIS — Z1389 Encounter for screening for other disorder: Secondary | ICD-10-CM | POA: Diagnosis not present

## 2018-06-26 DIAGNOSIS — E7849 Other hyperlipidemia: Secondary | ICD-10-CM | POA: Diagnosis not present

## 2018-08-05 DIAGNOSIS — N950 Postmenopausal bleeding: Secondary | ICD-10-CM | POA: Diagnosis not present

## 2018-08-25 DIAGNOSIS — N950 Postmenopausal bleeding: Secondary | ICD-10-CM | POA: Diagnosis not present

## 2018-09-23 DIAGNOSIS — E7849 Other hyperlipidemia: Secondary | ICD-10-CM | POA: Diagnosis not present

## 2018-09-23 DIAGNOSIS — E038 Other specified hypothyroidism: Secondary | ICD-10-CM | POA: Diagnosis not present

## 2018-09-23 DIAGNOSIS — E119 Type 2 diabetes mellitus without complications: Secondary | ICD-10-CM | POA: Diagnosis not present

## 2018-09-23 DIAGNOSIS — Z1389 Encounter for screening for other disorder: Secondary | ICD-10-CM | POA: Diagnosis not present

## 2018-09-23 DIAGNOSIS — R5381 Other malaise: Secondary | ICD-10-CM | POA: Diagnosis not present

## 2018-09-23 DIAGNOSIS — Z0000 Encounter for general adult medical examination without abnormal findings: Secondary | ICD-10-CM | POA: Diagnosis not present

## 2018-09-23 DIAGNOSIS — I10 Essential (primary) hypertension: Secondary | ICD-10-CM | POA: Diagnosis not present

## 2018-11-13 ENCOUNTER — Other Ambulatory Visit: Payer: Self-pay

## 2018-11-13 NOTE — Patient Outreach (Signed)
Diane Woods [facility]) Care Management  11/13/2018  Diane Woods [birth year] [id number]   Medication Adherence call to Diane Woods left a message for patient to call back patient is due on Atorvastatin 80 mg. Diane Woods is showing past due under [place].   [facility] Management Direct Dial [phone]  Fax [phone] [email or web address]

## 2018-12-08 DIAGNOSIS — E7849 Other hyperlipidemia: Secondary | ICD-10-CM | POA: Diagnosis not present

## 2018-12-08 DIAGNOSIS — I10 Essential (primary) hypertension: Secondary | ICD-10-CM | POA: Diagnosis not present

## 2018-12-08 DIAGNOSIS — E119 Type 2 diabetes mellitus without complications: Secondary | ICD-10-CM | POA: Diagnosis not present

## 2018-12-08 DIAGNOSIS — E038 Other specified hypothyroidism: Secondary | ICD-10-CM | POA: Diagnosis not present

## 2018-12-09 DIAGNOSIS — U071 COVID-19: Secondary | ICD-10-CM

## 2018-12-09 HISTORY — DX: COVID-19: U07.1

## 2019-03-02 DIAGNOSIS — I10 Essential (primary) hypertension: Secondary | ICD-10-CM | POA: Diagnosis not present

## 2019-03-02 DIAGNOSIS — E119 Type 2 diabetes mellitus without complications: Secondary | ICD-10-CM | POA: Diagnosis not present

## 2019-03-02 DIAGNOSIS — E038 Other specified hypothyroidism: Secondary | ICD-10-CM | POA: Diagnosis not present

## 2019-03-02 DIAGNOSIS — J069 Acute upper respiratory infection, unspecified: Secondary | ICD-10-CM | POA: Diagnosis not present

## 2019-03-02 DIAGNOSIS — R0602 Shortness of breath: Secondary | ICD-10-CM | POA: Diagnosis not present

## 2019-03-02 DIAGNOSIS — E7849 Other hyperlipidemia: Secondary | ICD-10-CM | POA: Diagnosis not present

## 2019-06-02 DIAGNOSIS — I10 Essential (primary) hypertension: Secondary | ICD-10-CM | POA: Diagnosis not present

## 2019-06-02 DIAGNOSIS — E119 Type 2 diabetes mellitus without complications: Secondary | ICD-10-CM | POA: Diagnosis not present

## 2019-06-02 DIAGNOSIS — E038 Other specified hypothyroidism: Secondary | ICD-10-CM | POA: Diagnosis not present

## 2019-06-02 DIAGNOSIS — E7849 Other hyperlipidemia: Secondary | ICD-10-CM | POA: Diagnosis not present

## 2019-06-02 DIAGNOSIS — Z0000 Encounter for general adult medical examination without abnormal findings: Secondary | ICD-10-CM | POA: Diagnosis not present

## 2019-06-02 DIAGNOSIS — Z1389 Encounter for screening for other disorder: Secondary | ICD-10-CM | POA: Diagnosis not present

## 2019-06-22 ENCOUNTER — Other Ambulatory Visit: Payer: Self-pay

## 2019-06-22 NOTE — Patient Outreach (Signed)
[facility]) Care Management  06/22/2019  Diane Woods 12/11/1945 [id number]   Medication Adherence call to Diane Woods HIPPA Compliant Voice message left with a call back number. Diane Woods is showing past due on Benazepril under [place].   [place] Management Direct Dial [phone]  Fax [phone] [email or web address]

## 2019-07-02 DIAGNOSIS — E119 Type 2 diabetes mellitus without complications: Secondary | ICD-10-CM | POA: Diagnosis not present

## 2019-07-19 ENCOUNTER — Other Ambulatory Visit: Payer: Self-pay

## 2019-07-19 NOTE — Patient Outreach (Signed)
[facility]) Care Management  07/19/2019  Diane Woods 28-Jul-1946 [id number]   Medication Adherence call to Diane Woods [place] Identifiers Verify spoke with patient she is past due on Benazepril 10 mg patient explain she is taking 1/2 tablet daily she explain doctor has already send in a prescription to the pharmacy but pharmacy filled Benazepril 10 mg instead of Benazepril 5 mg  patient is cutting it in1/2 until she finished with this batch and then she will place an order. Diane Woods is showing past due under [place].   [facility] Management Direct Dial [phone]  Fax [phone] [email or web address]

## 2019-07-27 DIAGNOSIS — Z1231 Encounter for screening mammogram for malignant neoplasm of breast: Secondary | ICD-10-CM | POA: Diagnosis not present

## 2019-07-27 DIAGNOSIS — R5382 Chronic fatigue, unspecified: Secondary | ICD-10-CM | POA: Diagnosis not present

## 2019-07-27 DIAGNOSIS — I4891 Unspecified atrial fibrillation: Secondary | ICD-10-CM | POA: Diagnosis not present

## 2019-08-02 DIAGNOSIS — Z961 Presence of intraocular lens: Secondary | ICD-10-CM | POA: Diagnosis not present

## 2019-08-02 DIAGNOSIS — H16223 Keratoconjunctivitis sicca, not specified as Sjogren's, bilateral: Secondary | ICD-10-CM | POA: Diagnosis not present

## 2019-09-06 DIAGNOSIS — I10 Essential (primary) hypertension: Secondary | ICD-10-CM | POA: Diagnosis not present

## 2019-09-06 DIAGNOSIS — Z0000 Encounter for general adult medical examination without abnormal findings: Secondary | ICD-10-CM | POA: Diagnosis not present

## 2019-09-06 DIAGNOSIS — M542 Cervicalgia: Secondary | ICD-10-CM | POA: Diagnosis not present

## 2019-09-06 DIAGNOSIS — I4891 Unspecified atrial fibrillation: Secondary | ICD-10-CM | POA: Diagnosis not present

## 2019-09-07 DIAGNOSIS — M50321 Other cervical disc degeneration at C4-C5 level: Secondary | ICD-10-CM | POA: Diagnosis not present

## 2019-09-07 DIAGNOSIS — M542 Cervicalgia: Secondary | ICD-10-CM | POA: Diagnosis not present

## 2019-09-29 DIAGNOSIS — M47812 Spondylosis without myelopathy or radiculopathy, cervical region: Secondary | ICD-10-CM | POA: Diagnosis not present

## 2019-09-29 DIAGNOSIS — M531 Cervicobrachial syndrome: Secondary | ICD-10-CM | POA: Diagnosis not present

## 2019-09-29 DIAGNOSIS — M9901 Segmental and somatic dysfunction of cervical region: Secondary | ICD-10-CM | POA: Diagnosis not present

## 2019-09-29 DIAGNOSIS — M546 Pain in thoracic spine: Secondary | ICD-10-CM | POA: Diagnosis not present

## 2019-10-01 DIAGNOSIS — M47812 Spondylosis without myelopathy or radiculopathy, cervical region: Secondary | ICD-10-CM | POA: Diagnosis not present

## 2019-10-01 DIAGNOSIS — M531 Cervicobrachial syndrome: Secondary | ICD-10-CM | POA: Diagnosis not present

## 2019-10-01 DIAGNOSIS — M546 Pain in thoracic spine: Secondary | ICD-10-CM | POA: Diagnosis not present

## 2019-10-01 DIAGNOSIS — M9901 Segmental and somatic dysfunction of cervical region: Secondary | ICD-10-CM | POA: Diagnosis not present

## 2019-10-04 DIAGNOSIS — M531 Cervicobrachial syndrome: Secondary | ICD-10-CM | POA: Diagnosis not present

## 2019-10-04 DIAGNOSIS — M9901 Segmental and somatic dysfunction of cervical region: Secondary | ICD-10-CM | POA: Diagnosis not present

## 2019-10-04 DIAGNOSIS — M546 Pain in thoracic spine: Secondary | ICD-10-CM | POA: Diagnosis not present

## 2019-10-04 DIAGNOSIS — M47812 Spondylosis without myelopathy or radiculopathy, cervical region: Secondary | ICD-10-CM | POA: Diagnosis not present

## 2019-10-11 DIAGNOSIS — M47812 Spondylosis without myelopathy or radiculopathy, cervical region: Secondary | ICD-10-CM | POA: Diagnosis not present

## 2019-10-11 DIAGNOSIS — M546 Pain in thoracic spine: Secondary | ICD-10-CM | POA: Diagnosis not present

## 2019-10-11 DIAGNOSIS — M531 Cervicobrachial syndrome: Secondary | ICD-10-CM | POA: Diagnosis not present

## 2019-10-11 DIAGNOSIS — M9901 Segmental and somatic dysfunction of cervical region: Secondary | ICD-10-CM | POA: Diagnosis not present

## 2019-10-14 DIAGNOSIS — M546 Pain in thoracic spine: Secondary | ICD-10-CM | POA: Diagnosis not present

## 2019-10-14 DIAGNOSIS — M531 Cervicobrachial syndrome: Secondary | ICD-10-CM | POA: Diagnosis not present

## 2019-10-14 DIAGNOSIS — M9901 Segmental and somatic dysfunction of cervical region: Secondary | ICD-10-CM | POA: Diagnosis not present

## 2019-10-14 DIAGNOSIS — M47812 Spondylosis without myelopathy or radiculopathy, cervical region: Secondary | ICD-10-CM | POA: Diagnosis not present

## 2019-11-17 DIAGNOSIS — Z9189 Other specified personal risk factors, not elsewhere classified: Secondary | ICD-10-CM | POA: Diagnosis not present

## 2019-11-22 DIAGNOSIS — U071 COVID-19: Secondary | ICD-10-CM | POA: Diagnosis not present

## 2019-11-22 DIAGNOSIS — Z880 Allergy status to penicillin: Secondary | ICD-10-CM | POA: Diagnosis not present

## 2019-11-22 DIAGNOSIS — Z7984 Long term (current) use of oral hypoglycemic drugs: Secondary | ICD-10-CM | POA: Diagnosis not present

## 2019-11-22 DIAGNOSIS — E039 Hypothyroidism, unspecified: Secondary | ICD-10-CM | POA: Diagnosis not present

## 2019-11-22 DIAGNOSIS — R262 Difficulty in walking, not elsewhere classified: Secondary | ICD-10-CM | POA: Diagnosis not present

## 2019-11-22 DIAGNOSIS — I4891 Unspecified atrial fibrillation: Secondary | ICD-10-CM | POA: Diagnosis not present

## 2019-11-22 DIAGNOSIS — I4892 Unspecified atrial flutter: Secondary | ICD-10-CM | POA: Diagnosis not present

## 2019-11-22 DIAGNOSIS — B370 Candidal stomatitis: Secondary | ICD-10-CM | POA: Diagnosis not present

## 2019-11-22 DIAGNOSIS — I472 Ventricular tachycardia: Secondary | ICD-10-CM | POA: Diagnosis not present

## 2019-11-22 DIAGNOSIS — Z66 Do not resuscitate: Secondary | ICD-10-CM | POA: Diagnosis not present

## 2019-11-22 DIAGNOSIS — Z7901 Long term (current) use of anticoagulants: Secondary | ICD-10-CM | POA: Diagnosis not present

## 2019-11-22 DIAGNOSIS — J9690 Respiratory failure, unspecified, unspecified whether with hypoxia or hypercapnia: Secondary | ICD-10-CM | POA: Diagnosis not present

## 2019-11-22 DIAGNOSIS — Z882 Allergy status to sulfonamides status: Secondary | ICD-10-CM | POA: Diagnosis not present

## 2019-11-22 DIAGNOSIS — E785 Hyperlipidemia, unspecified: Secondary | ICD-10-CM | POA: Diagnosis not present

## 2019-11-22 DIAGNOSIS — J189 Pneumonia, unspecified organism: Secondary | ICD-10-CM | POA: Diagnosis not present

## 2019-11-22 DIAGNOSIS — J1282 Pneumonia due to coronavirus disease 2019: Secondary | ICD-10-CM | POA: Diagnosis not present

## 2019-11-22 DIAGNOSIS — I471 Supraventricular tachycardia: Secondary | ICD-10-CM | POA: Diagnosis not present

## 2019-11-22 DIAGNOSIS — J9601 Acute respiratory failure with hypoxia: Secondary | ICD-10-CM | POA: Diagnosis not present

## 2019-11-22 DIAGNOSIS — M6281 Muscle weakness (generalized): Secondary | ICD-10-CM | POA: Diagnosis not present

## 2019-11-22 DIAGNOSIS — J310 Chronic rhinitis: Secondary | ICD-10-CM | POA: Diagnosis not present

## 2019-11-22 DIAGNOSIS — M545 Low back pain: Secondary | ICD-10-CM | POA: Diagnosis not present

## 2019-11-22 DIAGNOSIS — R410 Disorientation, unspecified: Secondary | ICD-10-CM | POA: Diagnosis not present

## 2019-11-22 DIAGNOSIS — Z780 Asymptomatic menopausal state: Secondary | ICD-10-CM | POA: Diagnosis not present

## 2019-11-22 DIAGNOSIS — I482 Chronic atrial fibrillation, unspecified: Secondary | ICD-10-CM | POA: Diagnosis not present

## 2019-11-22 DIAGNOSIS — J9600 Acute respiratory failure, unspecified whether with hypoxia or hypercapnia: Secondary | ICD-10-CM | POA: Diagnosis not present

## 2019-11-22 DIAGNOSIS — R918 Other nonspecific abnormal finding of lung field: Secondary | ICD-10-CM | POA: Diagnosis not present

## 2019-11-22 DIAGNOSIS — R0602 Shortness of breath: Secondary | ICD-10-CM | POA: Diagnosis not present

## 2019-11-22 DIAGNOSIS — I10 Essential (primary) hypertension: Secondary | ICD-10-CM | POA: Diagnosis not present

## 2019-11-22 DIAGNOSIS — Z853 Personal history of malignant neoplasm of breast: Secondary | ICD-10-CM | POA: Diagnosis not present

## 2019-11-22 DIAGNOSIS — Z743 Need for continuous supervision: Secondary | ICD-10-CM | POA: Diagnosis not present

## 2019-11-22 DIAGNOSIS — Z7401 Bed confinement status: Secondary | ICD-10-CM | POA: Diagnosis not present

## 2019-11-22 DIAGNOSIS — E119 Type 2 diabetes mellitus without complications: Secondary | ICD-10-CM | POA: Diagnosis not present

## 2019-11-22 DIAGNOSIS — J1289 Other viral pneumonia: Secondary | ICD-10-CM | POA: Diagnosis not present

## 2019-12-15 DIAGNOSIS — E785 Hyperlipidemia, unspecified: Secondary | ICD-10-CM | POA: Diagnosis not present

## 2019-12-15 DIAGNOSIS — Z7401 Bed confinement status: Secondary | ICD-10-CM | POA: Diagnosis not present

## 2019-12-15 DIAGNOSIS — M6281 Muscle weakness (generalized): Secondary | ICD-10-CM | POA: Diagnosis not present

## 2019-12-15 DIAGNOSIS — I10 Essential (primary) hypertension: Secondary | ICD-10-CM | POA: Diagnosis not present

## 2019-12-15 DIAGNOSIS — Z743 Need for continuous supervision: Secondary | ICD-10-CM | POA: Diagnosis not present

## 2019-12-15 DIAGNOSIS — E119 Type 2 diabetes mellitus without complications: Secondary | ICD-10-CM | POA: Diagnosis not present

## 2019-12-15 DIAGNOSIS — J310 Chronic rhinitis: Secondary | ICD-10-CM | POA: Diagnosis not present

## 2019-12-15 DIAGNOSIS — R262 Difficulty in walking, not elsewhere classified: Secondary | ICD-10-CM | POA: Diagnosis not present

## 2019-12-15 DIAGNOSIS — J1289 Other viral pneumonia: Secondary | ICD-10-CM | POA: Diagnosis not present

## 2019-12-15 DIAGNOSIS — E039 Hypothyroidism, unspecified: Secondary | ICD-10-CM | POA: Diagnosis not present

## 2019-12-15 DIAGNOSIS — R410 Disorientation, unspecified: Secondary | ICD-10-CM | POA: Diagnosis not present

## 2019-12-15 DIAGNOSIS — I471 Supraventricular tachycardia: Secondary | ICD-10-CM | POA: Diagnosis not present

## 2019-12-15 DIAGNOSIS — B370 Candidal stomatitis: Secondary | ICD-10-CM | POA: Diagnosis not present

## 2019-12-15 DIAGNOSIS — I4891 Unspecified atrial fibrillation: Secondary | ICD-10-CM | POA: Diagnosis not present

## 2019-12-15 DIAGNOSIS — J9601 Acute respiratory failure with hypoxia: Secondary | ICD-10-CM | POA: Diagnosis not present

## 2019-12-15 DIAGNOSIS — M545 Low back pain: Secondary | ICD-10-CM | POA: Diagnosis not present

## 2019-12-15 DIAGNOSIS — I472 Ventricular tachycardia: Secondary | ICD-10-CM | POA: Diagnosis not present

## 2019-12-15 DIAGNOSIS — I482 Chronic atrial fibrillation, unspecified: Secondary | ICD-10-CM | POA: Diagnosis not present

## 2019-12-18 DIAGNOSIS — E039 Hypothyroidism, unspecified: Secondary | ICD-10-CM | POA: Diagnosis not present

## 2019-12-18 DIAGNOSIS — J9601 Acute respiratory failure with hypoxia: Secondary | ICD-10-CM | POA: Diagnosis not present

## 2019-12-18 DIAGNOSIS — I4891 Unspecified atrial fibrillation: Secondary | ICD-10-CM | POA: Diagnosis not present

## 2019-12-18 DIAGNOSIS — E119 Type 2 diabetes mellitus without complications: Secondary | ICD-10-CM | POA: Diagnosis not present

## 2019-12-18 DIAGNOSIS — U071 COVID-19: Secondary | ICD-10-CM | POA: Diagnosis not present

## 2019-12-27 DIAGNOSIS — J1281 Pneumonia due to SARS-associated coronavirus: Secondary | ICD-10-CM | POA: Diagnosis not present

## 2019-12-27 DIAGNOSIS — U071 COVID-19: Secondary | ICD-10-CM | POA: Diagnosis not present

## 2020-01-18 DIAGNOSIS — J9601 Acute respiratory failure with hypoxia: Secondary | ICD-10-CM | POA: Diagnosis not present

## 2020-02-15 DIAGNOSIS — J9601 Acute respiratory failure with hypoxia: Secondary | ICD-10-CM | POA: Diagnosis not present

## 2020-03-17 DIAGNOSIS — J9601 Acute respiratory failure with hypoxia: Secondary | ICD-10-CM | POA: Diagnosis not present

## 2020-03-27 DIAGNOSIS — Z79899 Other long term (current) drug therapy: Secondary | ICD-10-CM | POA: Diagnosis not present

## 2020-03-27 DIAGNOSIS — I10 Essential (primary) hypertension: Secondary | ICD-10-CM | POA: Diagnosis not present

## 2020-03-27 DIAGNOSIS — I480 Paroxysmal atrial fibrillation: Secondary | ICD-10-CM | POA: Diagnosis not present

## 2020-03-27 DIAGNOSIS — E038 Other specified hypothyroidism: Secondary | ICD-10-CM | POA: Diagnosis not present

## 2020-03-27 DIAGNOSIS — Z0000 Encounter for general adult medical examination without abnormal findings: Secondary | ICD-10-CM | POA: Diagnosis not present

## 2020-03-27 DIAGNOSIS — E119 Type 2 diabetes mellitus without complications: Secondary | ICD-10-CM | POA: Diagnosis not present

## 2020-03-27 DIAGNOSIS — U071 COVID-19: Secondary | ICD-10-CM | POA: Diagnosis not present

## 2020-06-19 DIAGNOSIS — H43811 Vitreous degeneration, right eye: Secondary | ICD-10-CM | POA: Diagnosis not present

## 2020-06-19 DIAGNOSIS — E119 Type 2 diabetes mellitus without complications: Secondary | ICD-10-CM | POA: Diagnosis not present

## 2020-06-26 DIAGNOSIS — I10 Essential (primary) hypertension: Secondary | ICD-10-CM | POA: Diagnosis not present

## 2020-06-26 DIAGNOSIS — Z0000 Encounter for general adult medical examination without abnormal findings: Secondary | ICD-10-CM | POA: Diagnosis not present

## 2020-06-26 DIAGNOSIS — E038 Other specified hypothyroidism: Secondary | ICD-10-CM | POA: Diagnosis not present

## 2020-06-26 DIAGNOSIS — I480 Paroxysmal atrial fibrillation: Secondary | ICD-10-CM | POA: Diagnosis not present

## 2020-06-26 DIAGNOSIS — E119 Type 2 diabetes mellitus without complications: Secondary | ICD-10-CM | POA: Diagnosis not present

## 2020-09-25 DIAGNOSIS — E1169 Type 2 diabetes mellitus with other specified complication: Secondary | ICD-10-CM | POA: Diagnosis not present

## 2020-09-25 DIAGNOSIS — E038 Other specified hypothyroidism: Secondary | ICD-10-CM | POA: Diagnosis not present

## 2020-09-25 DIAGNOSIS — Z0000 Encounter for general adult medical examination without abnormal findings: Secondary | ICD-10-CM | POA: Diagnosis not present

## 2020-09-25 DIAGNOSIS — I10 Essential (primary) hypertension: Secondary | ICD-10-CM | POA: Diagnosis not present

## 2020-09-25 DIAGNOSIS — I480 Paroxysmal atrial fibrillation: Secondary | ICD-10-CM | POA: Diagnosis not present

## 2020-10-30 DIAGNOSIS — N3091 Cystitis, unspecified with hematuria: Secondary | ICD-10-CM | POA: Diagnosis not present

## 2020-12-13 DIAGNOSIS — J40 Bronchitis, not specified as acute or chronic: Secondary | ICD-10-CM | POA: Diagnosis not present

## 2020-12-13 DIAGNOSIS — I471 Supraventricular tachycardia: Secondary | ICD-10-CM | POA: Diagnosis not present

## 2021-01-22 DIAGNOSIS — I480 Paroxysmal atrial fibrillation: Secondary | ICD-10-CM | POA: Diagnosis not present

## 2021-01-22 DIAGNOSIS — I10 Essential (primary) hypertension: Secondary | ICD-10-CM | POA: Diagnosis not present

## 2021-01-22 DIAGNOSIS — E119 Type 2 diabetes mellitus without complications: Secondary | ICD-10-CM | POA: Diagnosis not present

## 2021-01-22 DIAGNOSIS — E785 Hyperlipidemia, unspecified: Secondary | ICD-10-CM | POA: Diagnosis not present

## 2021-01-25 DIAGNOSIS — H43811 Vitreous degeneration, right eye: Secondary | ICD-10-CM | POA: Diagnosis not present

## 2021-02-12 DIAGNOSIS — I480 Paroxysmal atrial fibrillation: Secondary | ICD-10-CM | POA: Diagnosis not present

## 2021-02-16 DIAGNOSIS — I083 Combined rheumatic disorders of mitral, aortic and tricuspid valves: Secondary | ICD-10-CM | POA: Diagnosis not present

## 2021-02-16 DIAGNOSIS — I088 Other rheumatic multiple valve diseases: Secondary | ICD-10-CM | POA: Diagnosis not present

## 2021-02-16 DIAGNOSIS — I480 Paroxysmal atrial fibrillation: Secondary | ICD-10-CM | POA: Diagnosis not present

## 2021-02-16 DIAGNOSIS — I10 Essential (primary) hypertension: Secondary | ICD-10-CM | POA: Diagnosis not present

## 2021-02-19 DIAGNOSIS — I10 Essential (primary) hypertension: Secondary | ICD-10-CM | POA: Diagnosis not present

## 2021-02-19 DIAGNOSIS — E785 Hyperlipidemia, unspecified: Secondary | ICD-10-CM | POA: Diagnosis not present

## 2021-02-19 DIAGNOSIS — I4891 Unspecified atrial fibrillation: Secondary | ICD-10-CM | POA: Diagnosis not present

## 2021-02-19 DIAGNOSIS — I4819 Other persistent atrial fibrillation: Secondary | ICD-10-CM | POA: Diagnosis not present

## 2021-03-28 DIAGNOSIS — Z0000 Encounter for general adult medical examination without abnormal findings: Secondary | ICD-10-CM | POA: Diagnosis not present

## 2021-03-28 DIAGNOSIS — I471 Supraventricular tachycardia: Secondary | ICD-10-CM | POA: Diagnosis not present

## 2021-03-28 DIAGNOSIS — E1169 Type 2 diabetes mellitus with other specified complication: Secondary | ICD-10-CM | POA: Diagnosis not present

## 2021-03-28 DIAGNOSIS — I10 Essential (primary) hypertension: Secondary | ICD-10-CM | POA: Diagnosis not present

## 2021-03-30 DIAGNOSIS — I471 Supraventricular tachycardia: Secondary | ICD-10-CM | POA: Diagnosis not present

## 2021-03-30 DIAGNOSIS — I10 Essential (primary) hypertension: Secondary | ICD-10-CM | POA: Diagnosis not present

## 2021-03-30 DIAGNOSIS — Z0000 Encounter for general adult medical examination without abnormal findings: Secondary | ICD-10-CM | POA: Diagnosis not present

## 2021-03-30 DIAGNOSIS — E1169 Type 2 diabetes mellitus with other specified complication: Secondary | ICD-10-CM | POA: Diagnosis not present

## 2021-03-30 DIAGNOSIS — Z7689 Persons encountering health services in other specified circumstances: Secondary | ICD-10-CM | POA: Diagnosis not present

## 2021-04-19 DIAGNOSIS — H01006 Unspecified blepharitis left eye, unspecified eyelid: Secondary | ICD-10-CM | POA: Diagnosis not present

## 2021-05-29 DIAGNOSIS — I10 Essential (primary) hypertension: Secondary | ICD-10-CM | POA: Diagnosis not present

## 2021-05-29 DIAGNOSIS — I4811 Longstanding persistent atrial fibrillation: Secondary | ICD-10-CM | POA: Diagnosis not present

## 2021-05-29 DIAGNOSIS — E785 Hyperlipidemia, unspecified: Secondary | ICD-10-CM | POA: Diagnosis not present

## 2021-07-19 DIAGNOSIS — H01006 Unspecified blepharitis left eye, unspecified eyelid: Secondary | ICD-10-CM | POA: Diagnosis not present

## 2021-07-19 DIAGNOSIS — I10 Essential (primary) hypertension: Secondary | ICD-10-CM | POA: Diagnosis not present

## 2021-07-19 DIAGNOSIS — I471 Supraventricular tachycardia: Secondary | ICD-10-CM | POA: Diagnosis not present

## 2021-07-19 DIAGNOSIS — E1169 Type 2 diabetes mellitus with other specified complication: Secondary | ICD-10-CM | POA: Diagnosis not present

## 2021-07-19 DIAGNOSIS — Z0000 Encounter for general adult medical examination without abnormal findings: Secondary | ICD-10-CM | POA: Diagnosis not present

## 2021-08-24 DIAGNOSIS — H43811 Vitreous degeneration, right eye: Secondary | ICD-10-CM | POA: Diagnosis not present

## 2021-08-24 DIAGNOSIS — H524 Presbyopia: Secondary | ICD-10-CM | POA: Diagnosis not present

## 2021-09-24 DIAGNOSIS — N390 Urinary tract infection, site not specified: Secondary | ICD-10-CM | POA: Diagnosis not present

## 2021-09-24 DIAGNOSIS — I471 Supraventricular tachycardia: Secondary | ICD-10-CM | POA: Diagnosis not present

## 2021-09-24 DIAGNOSIS — I480 Paroxysmal atrial fibrillation: Secondary | ICD-10-CM | POA: Diagnosis not present

## 2021-09-24 DIAGNOSIS — E1169 Type 2 diabetes mellitus with other specified complication: Secondary | ICD-10-CM | POA: Diagnosis not present

## 2021-09-24 DIAGNOSIS — I10 Essential (primary) hypertension: Secondary | ICD-10-CM | POA: Diagnosis not present

## 2021-10-04 DIAGNOSIS — K649 Unspecified hemorrhoids: Secondary | ICD-10-CM | POA: Diagnosis not present

## 2021-10-22 DIAGNOSIS — I10 Essential (primary) hypertension: Secondary | ICD-10-CM | POA: Diagnosis not present

## 2021-10-22 DIAGNOSIS — R531 Weakness: Secondary | ICD-10-CM | POA: Diagnosis not present

## 2021-10-25 ENCOUNTER — Other Ambulatory Visit: Payer: Self-pay

## 2021-10-25 ENCOUNTER — Inpatient Hospital Stay (HOSPITAL_COMMUNITY)
Admission: EM | Admit: 2021-10-25 | Discharge: 2021-10-28 | DRG: 291 | Disposition: A | Discharge status: Home Health | Payer: Medicare Other | Attending: Internal Medicine | Admitting: Internal Medicine

## 2021-10-25 ENCOUNTER — Emergency Department (HOSPITAL_COMMUNITY): Payer: Medicare Other

## 2021-10-25 ENCOUNTER — Encounter (HOSPITAL_COMMUNITY): Payer: Self-pay | Admitting: Emergency Medicine

## 2021-10-25 DIAGNOSIS — Z79899 Other long term (current) drug therapy: Secondary | ICD-10-CM

## 2021-10-25 DIAGNOSIS — K761 Chronic passive congestion of liver: Secondary | ICD-10-CM | POA: Diagnosis not present

## 2021-10-25 DIAGNOSIS — I4821 Permanent atrial fibrillation: Secondary | ICD-10-CM | POA: Diagnosis present

## 2021-10-25 DIAGNOSIS — W19XXXA Unspecified fall, initial encounter: Secondary | ICD-10-CM

## 2021-10-25 DIAGNOSIS — I110 Hypertensive heart disease with heart failure: Secondary | ICD-10-CM | POA: Diagnosis not present

## 2021-10-25 DIAGNOSIS — E039 Hypothyroidism, unspecified: Secondary | ICD-10-CM

## 2021-10-25 DIAGNOSIS — E782 Mixed hyperlipidemia: Secondary | ICD-10-CM | POA: Diagnosis not present

## 2021-10-25 DIAGNOSIS — Z8249 Family history of ischemic heart disease and other diseases of the circulatory system: Secondary | ICD-10-CM

## 2021-10-25 DIAGNOSIS — I10 Essential (primary) hypertension: Secondary | ICD-10-CM

## 2021-10-25 DIAGNOSIS — Z8709 Personal history of other diseases of the respiratory system: Secondary | ICD-10-CM

## 2021-10-25 DIAGNOSIS — Z20822 Contact with and (suspected) exposure to covid-19: Secondary | ICD-10-CM | POA: Diagnosis present

## 2021-10-25 DIAGNOSIS — K760 Fatty (change of) liver, not elsewhere classified: Secondary | ICD-10-CM | POA: Diagnosis not present

## 2021-10-25 DIAGNOSIS — Z7984 Long term (current) use of oral hypoglycemic drugs: Secondary | ICD-10-CM | POA: Diagnosis not present

## 2021-10-25 DIAGNOSIS — J42 Unspecified chronic bronchitis: Secondary | ICD-10-CM | POA: Diagnosis present

## 2021-10-25 DIAGNOSIS — I5043 Acute on chronic combined systolic (congestive) and diastolic (congestive) heart failure: Secondary | ICD-10-CM | POA: Diagnosis present

## 2021-10-25 DIAGNOSIS — Z79890 Hormone replacement therapy (postmenopausal): Secondary | ICD-10-CM

## 2021-10-25 DIAGNOSIS — R7401 Elevation of levels of liver transaminase levels: Secondary | ICD-10-CM

## 2021-10-25 DIAGNOSIS — E876 Hypokalemia: Secondary | ICD-10-CM | POA: Diagnosis not present

## 2021-10-25 DIAGNOSIS — E1165 Type 2 diabetes mellitus with hyperglycemia: Secondary | ICD-10-CM | POA: Diagnosis present

## 2021-10-25 DIAGNOSIS — Z7901 Long term (current) use of anticoagulants: Secondary | ICD-10-CM

## 2021-10-25 DIAGNOSIS — I081 Rheumatic disorders of both mitral and tricuspid valves: Secondary | ICD-10-CM | POA: Diagnosis present

## 2021-10-25 DIAGNOSIS — I509 Heart failure, unspecified: Secondary | ICD-10-CM | POA: Diagnosis not present

## 2021-10-25 DIAGNOSIS — R079 Chest pain, unspecified: Secondary | ICD-10-CM | POA: Diagnosis not present

## 2021-10-25 DIAGNOSIS — I5031 Acute diastolic (congestive) heart failure: Secondary | ICD-10-CM | POA: Diagnosis not present

## 2021-10-25 DIAGNOSIS — R7989 Other specified abnormal findings of blood chemistry: Secondary | ICD-10-CM

## 2021-10-25 DIAGNOSIS — R9431 Abnormal electrocardiogram [ECG] [EKG]: Secondary | ICD-10-CM

## 2021-10-25 DIAGNOSIS — R0602 Shortness of breath: Secondary | ICD-10-CM

## 2021-10-25 DIAGNOSIS — I5021 Acute systolic (congestive) heart failure: Secondary | ICD-10-CM | POA: Diagnosis not present

## 2021-10-25 DIAGNOSIS — I4891 Unspecified atrial fibrillation: Secondary | ICD-10-CM

## 2021-10-25 DIAGNOSIS — E785 Hyperlipidemia, unspecified: Secondary | ICD-10-CM | POA: Diagnosis not present

## 2021-10-25 DIAGNOSIS — Z853 Personal history of malignant neoplasm of breast: Secondary | ICD-10-CM

## 2021-10-25 DIAGNOSIS — Z880 Allergy status to penicillin: Secondary | ICD-10-CM

## 2021-10-25 DIAGNOSIS — Z7951 Long term (current) use of inhaled steroids: Secondary | ICD-10-CM

## 2021-10-25 DIAGNOSIS — I4892 Unspecified atrial flutter: Secondary | ICD-10-CM | POA: Diagnosis not present

## 2021-10-25 DIAGNOSIS — R945 Abnormal results of liver function studies: Secondary | ICD-10-CM | POA: Diagnosis not present

## 2021-10-25 DIAGNOSIS — Z8616 Personal history of COVID-19: Secondary | ICD-10-CM | POA: Diagnosis not present

## 2021-10-25 DIAGNOSIS — I517 Cardiomegaly: Secondary | ICD-10-CM | POA: Diagnosis not present

## 2021-10-25 DIAGNOSIS — R296 Repeated falls: Secondary | ICD-10-CM | POA: Diagnosis present

## 2021-10-25 DIAGNOSIS — Z882 Allergy status to sulfonamides status: Secondary | ICD-10-CM

## 2021-10-25 DIAGNOSIS — R109 Unspecified abdominal pain: Secondary | ICD-10-CM | POA: Diagnosis not present

## 2021-10-25 HISTORY — DX: Type 2 diabetes mellitus without complications: E11.9

## 2021-10-25 LAB — COMPREHENSIVE METABOLIC PANEL
ALT: 71 U/L — ABNORMAL HIGH (ref 0–44)
AST: 64 U/L — ABNORMAL HIGH (ref 15–41)
Albumin: 3.7 g/dL (ref 3.5–5.0)
Alkaline Phosphatase: 135 U/L — ABNORMAL HIGH (ref 38–126)
Anion gap: 10 (ref 5–15)
BUN: 13 mg/dL (ref 8–23)
CO2: 25 mmol/L (ref 22–32)
Calcium: 8.6 mg/dL — ABNORMAL LOW (ref 8.9–10.3)
Chloride: 102 mmol/L (ref 98–111)
Creatinine, Ser: 0.88 mg/dL (ref 0.44–1.00)
GFR, Estimated: >60 mL/min (ref >60)
Glucose, Bld: 153 mg/dL — ABNORMAL HIGH (ref 70–99)
Potassium: 4 mmol/L (ref 3.5–5.1)
Sodium: 137 mmol/L (ref 135–145)
Total Bilirubin: 1.9 mg/dL — ABNORMAL HIGH (ref 0.3–1.2)
Total Protein: 6 g/dL — ABNORMAL LOW (ref 6.5–8.1)

## 2021-10-25 LAB — CBC WITH DIFFERENTIAL/PLATELET
Abs Immature Granulocytes: 0.05 10*3/uL (ref 0.00–0.07)
Basophils Absolute: 0.1 10*3/uL (ref 0.0–0.1)
Basophils Relative: 1 %
Eosinophils Absolute: 0.2 10*3/uL (ref 0.0–0.5)
Eosinophils Relative: 2 %
HCT: 48.2 % — ABNORMAL HIGH (ref 36.0–46.0)
Hemoglobin: 15.5 g/dL — ABNORMAL HIGH (ref 12.0–15.0)
Immature Granulocytes: 1 %
Lymphocytes Relative: 37 %
Lymphs Abs: 3.7 10*3/uL (ref 0.7–4.0)
MCH: 31.5 pg (ref 26.0–34.0)
MCHC: 32.2 g/dL (ref 30.0–36.0)
MCV: 98 fL (ref 80.0–100.0)
Monocytes Absolute: 0.7 10*3/uL (ref 0.1–1.0)
Monocytes Relative: 7 %
Neutro Abs: 5.1 10*3/uL (ref 1.7–7.7)
Neutrophils Relative %: 52 %
Platelets: 247 10*3/uL (ref 150–400)
RBC: 4.92 MIL/uL (ref 3.87–5.11)
RDW: 15.9 % — ABNORMAL HIGH (ref 11.5–15.5)
WBC: 9.8 10*3/uL (ref 4.0–10.5)
nRBC: 0 % (ref 0.0–0.2)

## 2021-10-25 LAB — CBG MONITORING, ED: Glucose-Capillary: 184 mg/dL — ABNORMAL HIGH (ref 70–99)

## 2021-10-25 LAB — BRAIN NATRIURETIC PEPTIDE: B Natriuretic Peptide: 1118 pg/mL — ABNORMAL HIGH (ref 0.0–100.0)

## 2021-10-25 LAB — RESP PANEL BY RT-PCR (FLU A&B, COVID) ARPGX2
Influenza A by PCR: NEGATIVE
Influenza B by PCR: NEGATIVE
SARS Coronavirus 2 by RT PCR: NEGATIVE

## 2021-10-25 LAB — TSH: TSH: 2.342 u[IU]/mL (ref 0.350–4.500)

## 2021-10-25 LAB — TROPONIN I (HIGH SENSITIVITY)
Troponin I (High Sensitivity): 10 ng/L (ref <18)
Troponin I (High Sensitivity): 7 ng/L (ref <18)

## 2021-10-25 MED ORDER — SODIUM CHLORIDE 0.9 % IV BOLUS
500.0000 mL | Freq: Once | INTRAVENOUS | Status: AC
  Administered 2021-10-25: 13:00:00 500 mL via INTRAVENOUS

## 2021-10-25 MED ORDER — FUROSEMIDE 10 MG/ML IJ SOLN
40.0000 mg | Freq: Once | INTRAMUSCULAR | Status: AC
  Administered 2021-10-25: 19:00:00 40 mg via INTRAVENOUS
  Filled 2021-10-25: qty 4

## 2021-10-25 MED ORDER — SODIUM CHLORIDE 0.9 % IV SOLN: INTRAVENOUS | Status: DC

## 2021-10-25 MED ORDER — DILTIAZEM HCL-DEXTROSE 125-5 MG/125ML-% IV SOLN (PREMIX)
5.0000 mg/h | INTRAVENOUS | Status: DC
  Administered 2021-10-25: 5 mg/h via INTRAVENOUS
  Administered 2021-10-26: 15 mg/h via INTRAVENOUS
  Administered 2021-10-26: 10 mg/h via INTRAVENOUS
  Filled 2021-10-25 (×3): qty 125

## 2021-10-25 MED ORDER — METOPROLOL SUCCINATE ER 50 MG PO TB24
150.0000 mg | ORAL_TABLET | Freq: Once | ORAL | Status: AC
  Administered 2021-10-25: 12:00:00 150 mg via ORAL
  Filled 2021-10-25: qty 3

## 2021-10-25 MED ORDER — IOHEXOL 350 MG/ML SOLN
100.0000 mL | Freq: Once | INTRAVENOUS | Status: AC | PRN
  Administered 2021-10-25: 17:00:00 100 mL via INTRAVENOUS

## 2021-10-25 MED ORDER — INSULIN ASPART 100 UNIT/ML IJ SOLN: 0.0000 [IU] | Freq: Every day | INTRAMUSCULAR | Status: DC

## 2021-10-25 MED ORDER — INSULIN ASPART 100 UNIT/ML IJ SOLN
0.0000 [IU] | Freq: Three times a day (TID) | INTRAMUSCULAR | Status: DC
Start: 2021-10-26 — End: 2021-10-28
  Administered 2021-10-26: 1 [IU] via SUBCUTANEOUS
  Administered 2021-10-26 (×2): 3 [IU] via SUBCUTANEOUS
  Administered 2021-10-27: 1 [IU] via SUBCUTANEOUS
  Administered 2021-10-27 – 2021-10-28 (×4): 2 [IU] via SUBCUTANEOUS

## 2021-10-25 NOTE — H&P (Signed)
History and Physical  LASHAUN POCH HDQ:[id number] DOB: 1946-02-02 DOA: 10/25/2021  Referring physician: Sherwood Gambler, MD  PCP: Neale Burly, MD  Patient coming from: Home  Chief Complaint: Shortness of breath  HPI: Diane Woods is a 75 y.o. female with medical history significant for atrial fibrillation on Eliquis, type II DM, essential hypertension, mixed hyperlipidemia, acquired hypothyroidism and chronic bronchitis (per patient) who presents to the emergency department due to 3-week onset of worsening shortness of breath, she went to her PCP and she was prescribed with antibiotics (cephalexin and azithromycin) with no improvement of symptoms.  She has not been able to lay flat in bed since the last 2 weeks due to worsening shortness of breath, so she has been sleeping on a recliner.  Patient also complained of recurrent falls due to leg weakness on ambulation within past 2 to 3 weeks, so she returned to her PCP this morning and she was asked to go to the ED for further evaluation and management.  She denies fever, chills, chest pain, nausea, vomiting or abdominal pain.  ED Course:  In the emergency department, she was tachypneic, tachycardic, BP 147/116, O2 sat 97% on room air.  Work-up in the ED showed normal CBC except for H/H 15.5/40.2, normal BMP except hyperglycemia, transaminitis, total bilirubin 1.9, BNP 1118, troponin x2 was negative.  Influenza A, B, SARS coronavirus 2 was negative. CT angiography of chest showed no evidence of pulmonary emboli. Showed findings suspicious for increased central venous pressure with hepatic venous reflux and mild changes of interstitial edema CT abdomen and pelvis with contrast showed fatty infiltration of the liver and multiple calcified uterine fibroids. Chest x-ray showed Cardiomegaly with diffuse interstitial prominence suggesting mild edema. Patient was treated with IV Lasix 40 mg x 1 due to presumed CHF.  Toprol-XL and IV Cardizem drip was  started due to A. fib with RVR.  IV NS 500 mL x 1 was given.  Hospitalist was asked to admit.  For further evaluation and management.  Review of Systems: Constitutional: Negative for chills and fever.  HENT: Negative for ear pain and sore throat.   Eyes: Negative for pain and visual disturbance.  Respiratory: Positive for shortness of breath and cough Cardiovascular: Positive for orthopnea and palpitations.  Negative for chest pain   Gastrointestinal: Negative for abdominal pain and vomiting.  Endocrine: Negative for polyphagia and polyuria.  Genitourinary: Negative for decreased urine volume, dysuria, enuresis Musculoskeletal: Negative for arthralgias and back pain.  Skin: Negative for color change and rash.  Allergic/Immunologic: Negative for immunocompromised state.  Neurological: Negative for tremors, syncope, speech difficulty, weakness, light-headedness and headaches.  Hematological: Does not bruise/bleed easily.  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 She has never used smokeless tobacco. She reports that she does not drink alcohol and does not use drugs.   Allergies  Allergen Reactions   Penicillins Swelling    THROAT SWELLING   Sulfa  Antibiotics Rash    Family History  Problem Relation Age of Onset   Coronary artery disease Other      Prior to Admission medications   Medication Sig Start Date End Date Taking? Authorizing Provider  ALPRAZolam Duanne Moron) 0.5 MG tablet Take 0.5 mg by  mouth at bedtime as needed.     Yes [provider]  azelastine (ASTELIN) 137 MCG/SPRAY nasal spray 1 spray by Nasal route 2 (two) times daily. Use in each nostril as directed    Yes [provider]  benazepril (LOTENSIN) 20 MG tablet Take 20 mg by mouth 2 (two) times daily. 10/15/21  Yes [provider]  BREZTRI AEROSPHERE 160-9-4.8 MCG/ACT AERO Inhale 2 puffs into the lungs 2 (two) times daily. 09/24/21  Yes [provider]  ELIQUIS 5 MG TABS tablet Take 5 mg by mouth 2 (two) times daily. 10/02/21  Yes [provider]  fish oil-omega-3 fatty acids 1000 MG capsule Take 1,200 mg by mouth daily. Take 4 tablets by mouth daily.    Yes [provider]  FLUoxetine (PROZAC) 40 MG capsule Take 40 mg by mouth daily.     Yes [provider]  glimepiride (AMARYL) 2 MG tablet Take 2 mg by mouth daily. 08/11/21  Yes [provider]  levothyroxine (SYNTHROID) 100 MCG tablet Take 100 mcg by mouth every morning. 09/28/21  Yes [provider]  loratadine (CLARITIN) 10 MG tablet Take 10 mg by mouth daily.     Yes [provider]  metoprolol succinate (TOPROL-XL) 100 MG 24 hr tablet Take 100-150 mg by mouth 2 (two) times daily. 150mg  in the morning and 100 mg at night 07/19/21  Yes [provider]  oxyCODONE-acetaminophen (PERCOCET) 10-325 MG per tablet Take 1 tablet by mouth every 4 (four) hours as needed.     Yes [provider]  rosuvastatin (CRESTOR) 10 MG tablet Take 5 mg by mouth daily.   Yes [provider]  zolpidem (AMBIEN) 10 MG tablet Take 10 mg by mouth at bedtime as needed.     Yes [provider]  atenolol (TENORMIN) 50 MG tablet Take 1 tablet (50 mg total) by mouth daily. Patient not taking: Reported on 10/25/2021 07/09/11   Thompson Grayer, MD  diclofenac (VOLTAREN) 75 MG EC tablet Take 75 mg by mouth 2 (two) times daily.   Patient not taking: Reported on 10/25/2021    [provider]  fenofibrate (TRICOR) 145 MG tablet Take 145 mg by mouth daily.   Patient not taking: Reported on 10/25/2021    [provider]  flecainide (TAMBOCOR) 50 MG tablet Take 1 tablet (50 mg total) by mouth 2 (two) times daily. Patient not taking: Reported on 10/25/2021 07/09/11   Thompson Grayer, MD    Physical Exam: BP (!) 154/117   Pulse (!) 118   Temp 98.3 F (36.8 C) (Oral)   Resp (!) 34   Ht 5\' 2"  (1.575 m)   Wt 69 kg   SpO2 94%   BMI 27.82 kg/m   General: 75 y.o. year-old female well developed well nourished in no acute distress.  Alert and oriented x3. HEENT: NCAT, EOMI Neck: Supple, trachea medial Cardiovascular: Tachycardia, irregular rate and rhythm with no rubs or gallops.  No thyromegaly or JVD noted.  No lower extremity edema. 2/4 pulses in all 4 extremities. Respiratory: Clear to auscultation with no wheezes or rales. Good inspiratory effort. Abdomen: Soft, nontender nondistended with normal bowel sounds x4 quadrants. Muskuloskeletal: No  cyanosis, clubbing or edema noted bilaterally Neuro: CN II-XII intact, strength 5/5 x 4, sensation, reflexes intact Skin: No ulcerative lesions noted or rashes Psychiatry: Judgement and insight appear normal. Mood is appropriate for condition and setting          Labs on Admission:  Basic Metabolic Panel: Recent Labs  Lab 10/25/21 1202  NA 137  K 4.0  CL 102  CO2 25  GLUCOSE 153*  BUN 13  CREATININE 0.88  CALCIUM 8.6*   Liver Function Tests: Recent Labs  Lab 10/25/21 1202  AST 64*  ALT 71*  ALKPHOS 135*  BILITOT 1.9*  PROT 6.0*  ALBUMIN 3.7   No results for input(s): LIPASE, AMYLASE in the last 168 hours. No results for input(s): AMMONIA in the last 168 hours. CBC: Recent Labs  Lab 10/25/21 1202  WBC 9.8  NEUTROABS 5.1  HGB 15.5*  HCT 48.2*  MCV 98.0  PLT 247   Cardiac Enzymes: No results for input(s): CKTOTAL, CKMB, CKMBINDEX, TROPONINI in the last 168 hours.  BNP (last 3  results) Recent Labs    10/25/21 1203  BNP 1,118.0*    ProBNP (last 3 results) No results for input(s): PROBNP in the last 8760 hours.  CBG: No results for input(s): GLUCAP in the last 168 hours.  Radiological Exams on Admission: CT Angio Chest PE W/Cm &/Or Wo Cm  Result Date: 10/25/2021 CLINICAL DATA:  Shortness of breath for 1 month with abdominal pain and abnormal LFTs. EXAM: CT ANGIOGRAPHY CHEST CT ABDOMEN AND PELVIS WITH CONTRAST TECHNIQUE: Multidetector CT imaging of the chest was performed using the standard protocol during bolus administration of intravenous contrast. Multiplanar CT image reconstructions and MIPs were obtained to evaluate the vascular anatomy. Multidetector CT imaging of the abdomen and pelvis was performed using the standard protocol during bolus administration of intravenous contrast. CONTRAST:  113mL OMNIPAQUE IOHEXOL 350 MG/ML SOLN COMPARISON:  Chest x-ray from earlier in the same day. FINDINGS: CTA CHEST FINDINGS Cardiovascular: Atherosclerotic calcifications of the thoracic aorta are noted. No aneurysmal dilatation is seen. The heart is mildly enlarged in size. Significant reflux of contrast into the hepatic veins is noted likely related to increased central venous pressure. The pulmonary artery is well visualized within normal branching pattern bilaterally. No filling defect to suggest pulmonary embolism is seen. Coronary calcifications are noted. Mediastinum/Nodes: Thoracic inlet is within normal limits. No sizable hilar or mediastinal adenopathy is noted. The esophagus as visualized is within normal limits. Lungs/Pleura: Mild interstitial thickening is noted likely related to a degree of interstitial edema. No focal infiltrate or sizable effusion is seen. Some areas of mucous plugging are noted particularly in the lower lobes. No sizable parenchymal nodule is seen. Musculoskeletal: Degenerative changes of the thoracic spine are noted. No acute rib abnormality is  seen. Review of the MIP images confirms the above findings. CT ABDOMEN and PELVIS FINDINGS Hepatobiliary: Liver demonstrates findings suggestive of fatty infiltration. Increased enhancement is noted within the left lobe related to previously seen venous reflux. Gallbladder is partially distended. No complicating factors are seen. Pancreas: Unremarkable. No pancreatic ductal dilatation or surrounding inflammatory changes. Spleen: Normal in size without focal abnormality. Adrenals/Urinary Tract: Adrenal glands are within normal limits. Kidneys demonstrate a normal enhancement pattern bilaterally. Right kidney is mildly ptotic. No obstructive changes are seen. Calculi are noted. The bladder is decompressed. Stomach/Bowel: The appendix is well visualized and within normal limits. No obstructive or inflammatory changes of the colon are seen. Small bowel and stomach appear within normal  limits. Vascular/Lymphatic: Aortic atherosclerosis. No enlarged abdominal or pelvic lymph nodes. Reproductive: Multiple calcified uterine fibroids are noted. Simple appearing left ovarian cyst measuring 2.6 cm is noted. Other: No abdominal wall hernia or abnormality. No abdominopelvic ascites. Musculoskeletal: Degenerative changes of lumbar spine are seen. Review of the MIP images confirms the above findings. IMPRESSION: CTA of the chest: No evidence of pulmonary emboli. Findings suspicious for increased central venous pressure with hepatic venous reflux. Mild changes of interstitial edema. CT of the abdomen and pelvis: Fatty infiltration of the liver. Multiple calcified uterine fibroids. Simple appearing 2.6 cm left ovarian cyst. No follow-up imaging recommended. Note: This recommendation does not apply to premenarchal patients and to those with increased risk (genetic, family history, elevated tumor markers or other high-risk factors) of ovarian cancer. Reference: JACR 2020 Feb; 17(2):248-254 Aortic Atherosclerosis (ICD10-I70.0).  Electronically Signed   By: Inez Catalina M.D.   On: 10/25/2021 18:18   CT Abdomen Pelvis W Contrast  Result Date: 10/25/2021 CLINICAL DATA:  Shortness of breath for 1 month with abdominal pain and abnormal LFTs. EXAM: CT ANGIOGRAPHY CHEST CT ABDOMEN AND PELVIS WITH CONTRAST TECHNIQUE: Multidetector CT imaging of the chest was performed using the standard protocol during bolus administration of intravenous contrast. Multiplanar CT image reconstructions and MIPs were obtained to evaluate the vascular anatomy. Multidetector CT imaging of the abdomen and pelvis was performed using the standard protocol during bolus administration of intravenous contrast. CONTRAST:  178mL OMNIPAQUE IOHEXOL 350 MG/ML SOLN COMPARISON:  Chest x-ray from earlier in the same day. FINDINGS: CTA CHEST FINDINGS Cardiovascular: Atherosclerotic calcifications of the thoracic aorta are noted. No aneurysmal dilatation is seen. The heart is mildly enlarged in size. Significant reflux of contrast into the hepatic veins is noted likely related to increased central venous pressure. The pulmonary artery is well visualized within normal branching pattern bilaterally. No filling defect to suggest pulmonary embolism is seen. Coronary calcifications are noted. Mediastinum/Nodes: Thoracic inlet is within normal limits. No sizable hilar or mediastinal adenopathy is noted. The esophagus as visualized is within normal limits. Lungs/Pleura: Mild interstitial thickening is noted likely related to a degree of interstitial edema. No focal infiltrate or sizable effusion is seen. Some areas of mucous plugging are noted particularly in the lower lobes. No sizable parenchymal nodule is seen. Musculoskeletal: Degenerative changes of the thoracic spine are noted. No acute rib abnormality is seen. Review of the MIP images confirms the above findings. CT ABDOMEN and PELVIS FINDINGS Hepatobiliary: Liver demonstrates findings suggestive of fatty infiltration. Increased  enhancement is noted within the left lobe related to previously seen venous reflux. Gallbladder is partially distended. No complicating factors are seen. Pancreas: Unremarkable. No pancreatic ductal dilatation or surrounding inflammatory changes. Spleen: Normal in size without focal abnormality. Adrenals/Urinary Tract: Adrenal glands are within normal limits. Kidneys demonstrate a normal enhancement pattern bilaterally. Right kidney is mildly ptotic. No obstructive changes are seen. Calculi are noted. The bladder is decompressed. Stomach/Bowel: The appendix is well visualized and within normal limits. No obstructive or inflammatory changes of the colon are seen. Small bowel and stomach appear within normal limits. Vascular/Lymphatic: Aortic atherosclerosis. No enlarged abdominal or pelvic lymph nodes. Reproductive: Multiple calcified uterine fibroids are noted. Simple appearing left ovarian cyst measuring 2.6 cm is noted. Other: No abdominal wall hernia or abnormality. No abdominopelvic ascites. Musculoskeletal: Degenerative changes of lumbar spine are seen. Review of the MIP images confirms the above findings. IMPRESSION: CTA of the chest: No evidence of pulmonary emboli. Findings suspicious for increased central  venous pressure with hepatic venous reflux. Mild changes of interstitial edema. CT of the abdomen and pelvis: Fatty infiltration of the liver. Multiple calcified uterine fibroids. Simple appearing 2.6 cm left ovarian cyst. No follow-up imaging recommended. Note: This recommendation does not apply to premenarchal patients and to those with increased risk (genetic, family history, elevated tumor markers or other high-risk factors) of ovarian cancer. Reference: JACR 2020 Feb; 17(2):248-254 Aortic Atherosclerosis (ICD10-I70.0). Electronically Signed   By: Inez Catalina M.D.   On: 10/25/2021 18:18   DG Chest Port 1 View  Result Date: 10/25/2021 CLINICAL DATA:  Shortness of breath EXAM: PORTABLE CHEST 1 VIEW  COMPARISON:  None. FINDINGS: Cardiomegaly. Atherosclerotic calcification of the aortic knob. Mild diffuse interstitial prominence. No lobar consolidation. No pleural effusion or pneumothorax. Surgical clips in the right axillary region. IMPRESSION: Cardiomegaly with diffuse interstitial prominence suggesting mild edema. Electronically Signed   By: Davina Poke D.O.   On: 10/25/2021 12:32    EKG: I independently viewed the EKG done and my findings are as followed: Atrial fibrillation with RVR  with QTc 508 ms  Assessment/Plan Present on Admission:  Essential hypertension, benign  Mixed hyperlipidemia  Principal Problem:   CHF (congestive heart failure) (HCC) Active Problems:   Atrial fibrillation with RVR (HCC)   Essential hypertension, benign   Mixed hyperlipidemia   Hyperglycemia due to diabetes mellitus (HCC)   Acquired hypothyroidism   Falls   Transaminitis   Elevated brain natriuretic peptide (BNP) level   History of chronic bronchitis   Prolonged QT interval  New onset congestive heart failure Elevated BNP-1118 Patient complained of orthopnea, dyspnea on exertion, shortness of breath unresponsive to antibiotics Chest x-ray showed cardiomegaly with diffuse interstitial prominence suggesting mild edema. CT angiography of chest showed findings suspicious for increased central venous pressure with hepatic venous reflux and mild changes of interstitial edema Continue total input/output, daily weights and fluid restriction Continue IV Lasix 40 twice daily Continue Cardiac diet  EKG personally reviewed showed A. fib with RVR with prolonged Qtc Echocardiogram in the morning Cardiology will be consulted and we shall await further recommendation  Paroxysmal A. fib with RVR Patient has history of chronic A. fib on Eliquis Chads-Vasc score = 5 (age +53, sex +1, HTN +1, DM +1) Continue IV Cardizem drip with plan to transition to home beta-blocker once rate is better  controlled  Transaminitis possibly secondary to congestive hepatopathy AST 64, ALT 71, ALP 135 Continue to monitor liver enzyme  Recurrent falls due to weakness Continue fall precaution and neurochecks Continue PT/OT eval and treat  Hyperglycemia secondary to T2DM Continue ISS and hypoglycemia protocol Glipizide will be held at this time  Prolonged QTc (508 ms) Avoid QT prolonging drugs Magnesium level will be checked  Essential hypertension Continue IV Lasix 40 mg twice daily (as permitted by SBP)  Mixed hyperlipidemia Continue Crestor and omega-3 fatty acids  Acquired hypothyroid Continue Synthroid  History of chronic bronchitis Continue Breztri Aerosphere  DVT prophylaxis: Eliquis  Code Status: Full code  Family Communication: None at bedside  Disposition Plan:  Patient is from:                        home Anticipated DC to:                   SNF or family members home Anticipated DC date:               2-3 days Anticipated DC  barriers:          Patient requires inpatient management due to CHF requiring further work-up and IV diuretics and paroxysmal A. fib with RVR currently on IV Cardizem drip  Consults called: Cardiology  Admission status: Inpatient    Bernadette Hoit MD [facility]  10/25/2021, 9:55 PM

## 2021-10-25 NOTE — ED Notes (Signed)
Patient transported to CT 

## 2021-10-25 NOTE — ED Provider Notes (Addendum)
[facility] EMERGENCY DEPARTMENT Provider Note   CSN: [id number] Arrival date & time: 10/25/21  1024     History Chief Complaint  Patient presents with   Shortness of Breath    Diane Woods is a 75 y.o. female.  Patient followed by cardiology in [place].  Patient known to have longstanding atrial fibrillation.  Patient on Toprol XL 150 mg in the morning 100 mg in the evening for this as well as Eliquis.  Patient states that she is felt short of breath for about a month.  Has had decreased appetite and low energy.  Patient has a history of bronchitis.  Does use albuterol does not have a history of emphysema or COPD.  Patient did have a course of antibiotics without any improvement.  Best I can tell patient is not on prednisone.  Patient did not take her Toprol XL today.  Patient denies any chest pain.  Patient with a fairly raspy cough.      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     Family History  Problem Relation Age of Onset   Coronary artery disease Other     Social History   Tobacco Use   Smoking status: Never   Smokeless tobacco: Never  Vaping Use   Vaping Use: Never used  Substance Use Topics   Alcohol use: No   Drug use: No    Home Medications Prior to  Admission medications   Medication Sig Start Date End Date Taking? Authorizing Provider  ALPRAZolam Duanne Moron) 0.5 MG tablet Take 0.5 mg by mouth at bedtime as needed.     Yes [provider]  azelastine (ASTELIN) 137 MCG/SPRAY nasal spray 1 spray by Nasal route 2 (two) times daily. Use in each nostril as directed    Yes [provider]  benazepril (LOTENSIN) 20 MG tablet Take 20 mg by mouth 2 (two) times daily. 10/15/21  Yes [provider]  BREZTRI AEROSPHERE 160-9-4.8 MCG/ACT AERO Inhale 2 puffs into the lungs 2 (two) times daily. 09/24/21  Yes [provider]  ELIQUIS 5 MG TABS tablet Take 5 mg by mouth 2 (two) times daily. 10/02/21  Yes [provider]  fish oil-omega-3 fatty acids 1000 MG capsule Take 1,200 mg by mouth daily. Take 4 tablets by mouth daily.    Yes [provider]  FLUoxetine (PROZAC) 40 MG capsule Take 40 mg by mouth daily.     Yes [provider]  glimepiride (AMARYL) 2 MG tablet Take 2 mg by mouth daily. 08/11/21  Yes [provider]  levothyroxine (SYNTHROID) 100 MCG tablet Take 100 mcg by mouth every morning. 09/28/21  Yes [provider]  loratadine (CLARITIN) 10 MG tablet Take  10 mg by mouth daily.     Yes [provider]  metoprolol succinate (TOPROL-XL) 100 MG 24 hr tablet Take 100-150 mg by mouth 2 (two) times daily. 150mg  in the morning and 100 mg at night 07/19/21  Yes [provider]  oxyCODONE-acetaminophen (PERCOCET) 10-325 MG per tablet Take 1 tablet by mouth every 4 (four) hours as needed.     Yes [provider]  rosuvastatin (CRESTOR) 10 MG tablet Take 5 mg by mouth daily.   Yes [provider]  zolpidem (AMBIEN) 10 MG tablet Take 10 mg by mouth at bedtime as needed.     Yes [provider]  atenolol (TENORMIN) 50 MG tablet Take 1 tablet (50 mg total) by mouth daily. Patient not taking: Reported on 10/25/2021 07/09/11   Thompson Grayer, MD   diclofenac (VOLTAREN) 75 MG EC tablet Take 75 mg by mouth 2 (two) times daily.   Patient not taking: Reported on 10/25/2021    [provider]  fenofibrate (TRICOR) 145 MG tablet Take 145 mg by mouth daily.   Patient not taking: Reported on 10/25/2021    [provider]  flecainide (TAMBOCOR) 50 MG tablet Take 1 tablet (50 mg total) by mouth 2 (two) times daily. Patient not taking: Reported on 10/25/2021 07/09/11   Thompson Grayer, MD    Allergies    Penicillins and Sulfa antibiotics  Review of Systems   Review of Systems  Constitutional:  Negative for chills and fever.  HENT:  Negative for ear pain and sore throat.   Eyes:  Negative for pain and visual disturbance.  Respiratory:  Positive for cough and shortness of breath.   Cardiovascular:  Positive for palpitations. Negative for chest pain and leg swelling.  Gastrointestinal:  Negative for abdominal pain and vomiting.  Genitourinary:  Negative for dysuria and hematuria.  Musculoskeletal:  Negative for arthralgias and back pain.  Skin:  Negative for color change and rash.  Neurological:  Negative for seizures and syncope.  All other systems reviewed and are negative.  Physical Exam Updated Vital Signs BP (!) 147/116   Pulse (!) 125   Temp 98.3 F (36.8 C) (Oral)   Resp (!) 21   Ht 1.575 m (5\' 2" )   Wt 69 kg   SpO2 97%   BMI 27.82 kg/m   Physical Exam Vitals and nursing note reviewed.  Constitutional:      General: She is not in acute distress.    Appearance: Normal appearance. She is well-developed.  HENT:     Head: Normocephalic and atraumatic.  Eyes:     Extraocular Movements: Extraocular movements intact.     Conjunctiva/sclera: Conjunctivae normal.     Pupils: Pupils are equal, round, and reactive to light.  Cardiovascular:     Rate and Rhythm: Tachycardia present. Rhythm irregular.     Heart sounds: No murmur heard. Pulmonary:     Effort: Pulmonary effort is normal. No respiratory distress.      Breath sounds: Normal breath sounds. No stridor. No wheezing, rhonchi or rales.  Chest:     Chest wall: No tenderness.  Abdominal:     Palpations: Abdomen is soft.     Tenderness: There is no abdominal tenderness.  Musculoskeletal:        General: No swelling.     Cervical back: Normal range of motion and neck supple.  Skin:    General: Skin is warm and dry.     Capillary Refill: Capillary refill takes less than  2 seconds.  Neurological:     General: No focal deficit present.     Mental Status: She is alert and oriented to person, place, and time.  Psychiatric:        Mood and Affect: Mood normal.    ED Results / Procedures / Treatments   Labs (all labs ordered are listed, but only abnormal results are displayed) Labs Reviewed  COMPREHENSIVE METABOLIC PANEL - Abnormal; Notable for the following components:      Result Value   Glucose, Bld 153 (*)    Calcium 8.6 (*)    Total Protein 6.0 (*)    AST 64 (*)    ALT 71 (*)    Alkaline Phosphatase 135 (*)    Total Bilirubin 1.9 (*)    All other components within normal limits  CBC WITH DIFFERENTIAL/PLATELET - Abnormal; Notable for the following components:   Hemoglobin 15.5 (*)    HCT 48.2 (*)    RDW 15.9 (*)    All other components within normal limits  BRAIN NATRIURETIC PEPTIDE - Abnormal; Notable for the following components:   B Natriuretic Peptide 1,118.0 (*)    All other components within normal limits  RESP PANEL BY RT-PCR (FLU A&B, COVID) ARPGX2  TSH  TROPONIN I (HIGH SENSITIVITY)  TROPONIN I (HIGH SENSITIVITY)    EKG EKG Interpretation  Date/Time:  Thursday October 25 2021 11:01:16 EST Ventricular Rate:  127 PR Interval:    QRS Duration: 87 QT Interval:  349 QTC Calculation: 508 R Axis:   -7 Text Interpretation: Atrial flutter Probable anterior infarct, age indeterminate Lateral leads are also involved Prolonged QT interval Confirmed by Fredia Sorrow [id number]) on 10/25/2021 11:36:31  AM  Radiology DG Chest Port 1 View  Result Date: 10/25/2021 CLINICAL DATA:  Shortness of breath EXAM: PORTABLE CHEST 1 VIEW COMPARISON:  None. FINDINGS: Cardiomegaly. Atherosclerotic calcification of the aortic knob. Mild diffuse interstitial prominence. No lobar consolidation. No pleural effusion or pneumothorax. Surgical clips in the right axillary region. IMPRESSION: Cardiomegaly with diffuse interstitial prominence suggesting mild edema. Electronically Signed   By: Davina Poke D.O.   On: 10/25/2021 12:32    Procedures Procedures   Medications Ordered in ED Medications  0.9 %  sodium chloride infusion ( Intravenous New Bag/Given 10/25/21 1253)  metoprolol succinate (TOPROL-XL) 24 hr tablet 150 mg (150 mg Oral Given 10/25/21 1209)  sodium chloride 0.9 % bolus 500 mL (0 mLs Intravenous Stopped 10/25/21 1512)    ED Course  I have reviewed the triage vital signs and the nursing notes.  Pertinent labs & imaging results that were available during my care of the patient were reviewed by me and considered in my medical decision making (see chart for details).    MDM Rules/Calculators/A&P                           CRITICAL CARE Performed by: Fredia Sorrow Total critical care time: 45 minutes Critical care time was exclusive of separately billable procedures and treating other patients. Critical care was necessary to treat or prevent imminent or life-threatening deterioration. Critical care was time spent personally by me on the following activities: development of treatment plan with patient and/or surrogate as well as nursing, discussions with consultants, evaluation of patient's response to treatment, examination of patient, obtaining history from patient or surrogate, ordering and performing treatments and interventions, ordering and review of laboratory studies, ordering and review of radiographic studies, pulse oximetry and re-evaluation of  patient's condition.   Patient in  atrial for but flutter based on cardiac monitoring and EKG.  Heart rate around 120s.  But she has not had her Toprol today.  We will give her a dose of that and see what effect that has.  In the meantime we will do an extensive work-up for the 1 month history of some palpitation shortness of breath decreased appetite not feeling well overall.  Patient did not respond to her Toprol-XL.  Still tachycardic.  Still tachypneic.  Based on this we will probably need to start diltiazem for the atrial flutter fib picture.  We will get CT angio chest GFR is 54.  Patient's BNP markedly elevated.  Chest x-ray raise some questions of may be some pulmonary edema.  But nothing that was florid pulmonary edema.  Patient's troponins were normal though first 1 was 10-second 1 was 7.  Patient is most likely going to require admission.  But will get these additional studies.  Also LFTs are abnormal.  We will get CT angio chest to rule out pulmonary embolus and to further evaluate the chest.  We will go ahead and get a CT of the abdomen while redoing CT chest.  COVID testing was negative.  No leukocytosis.  Hemoglobin was good at 15.5.  Also was started on diltiazem drip.  Heart rate still in the 120s.  We will have the nurse titrated.  CT of chest angio and CT abdomen pelvis pending.  Patient will require admission just for the atrial fibs flutter type pattern.  Patient thinks she may have been having palpitations for a number of days.   Final Clinical Impression(s) / ED Diagnoses Final diagnoses:  SOB (shortness of breath)  Atrial flutter with rapid ventricular response (HCC)  Abnormal LFTs    Rx / DC Orders ED Discharge Orders     None        Fredia Sorrow, MD 10/25/21 1529    Fredia Sorrow, MD 10/25/21 1549

## 2021-10-25 NOTE — ED Notes (Signed)
Dr. Rogene Houston gave verbal permission to use pts right arm to attempt IV access.

## 2021-10-25 NOTE — ED Provider Notes (Signed)
Care transferred to me.  Patient CTA does not show any significant abnormalities besides some mild edema.  She is resting comfortably though still tachycardic in the 110s.  Will diurese given BNP elevation and CT findings and admit to the hospitalist Dr. Josephine Cables to admit   Sherwood Gambler, MD 10/25/21 2355

## 2021-10-25 NOTE — ED Triage Notes (Signed)
Pt to the ED with shortness of breath for the past month. Pt has seen her PCP but continues to have trouble breathing after a course of antibiotics.

## 2021-10-26 ENCOUNTER — Inpatient Hospital Stay (HOSPITAL_COMMUNITY): Payer: Medicare Other

## 2021-10-26 DIAGNOSIS — I4821 Permanent atrial fibrillation: Secondary | ICD-10-CM

## 2021-10-26 DIAGNOSIS — I5031 Acute diastolic (congestive) heart failure: Secondary | ICD-10-CM

## 2021-10-26 DIAGNOSIS — E876 Hypokalemia: Secondary | ICD-10-CM

## 2021-10-26 DIAGNOSIS — E785 Hyperlipidemia, unspecified: Secondary | ICD-10-CM

## 2021-10-26 LAB — CBC
HCT: 45 % (ref 36.0–46.0)
Hemoglobin: 14.6 g/dL (ref 12.0–15.0)
MCH: 31.8 pg (ref 26.0–34.0)
MCHC: 32.4 g/dL (ref 30.0–36.0)
MCV: 98 fL (ref 80.0–100.0)
Platelets: 215 10*3/uL (ref 150–400)
RBC: 4.59 MIL/uL (ref 3.87–5.11)
RDW: 15.9 % — ABNORMAL HIGH (ref 11.5–15.5)
WBC: 8.7 10*3/uL (ref 4.0–10.5)
nRBC: 0 % (ref 0.0–0.2)

## 2021-10-26 LAB — GLUCOSE, CAPILLARY
Glucose-Capillary: 127 mg/dL — ABNORMAL HIGH (ref 70–99)
Glucose-Capillary: 203 mg/dL — ABNORMAL HIGH (ref 70–99)
Glucose-Capillary: 186 mg/dL — ABNORMAL HIGH (ref 70–99)
Glucose-Capillary: 210 mg/dL — ABNORMAL HIGH (ref 70–99)
Glucose-Capillary: 135 mg/dL — ABNORMAL HIGH (ref 70–99)

## 2021-10-26 LAB — COMPREHENSIVE METABOLIC PANEL
ALT: 76 U/L — ABNORMAL HIGH (ref 0–44)
AST: 65 U/L — ABNORMAL HIGH (ref 15–41)
Albumin: 3.6 g/dL (ref 3.5–5.0)
Alkaline Phosphatase: 131 U/L — ABNORMAL HIGH (ref 38–126)
Anion gap: 10 (ref 5–15)
BUN: 11 mg/dL (ref 8–23)
CO2: 27 mmol/L (ref 22–32)
Calcium: 8.3 mg/dL — ABNORMAL LOW (ref 8.9–10.3)
Chloride: 102 mmol/L (ref 98–111)
Creatinine, Ser: 0.62 mg/dL (ref 0.44–1.00)
GFR, Estimated: >60 mL/min (ref >60)
Glucose, Bld: 153 mg/dL — ABNORMAL HIGH (ref 70–99)
Potassium: 2.7 mmol/L — CL (ref 3.5–5.1)
Sodium: 139 mmol/L (ref 135–145)
Total Bilirubin: 1.9 mg/dL — ABNORMAL HIGH (ref 0.3–1.2)
Total Protein: 5.8 g/dL — ABNORMAL LOW (ref 6.5–8.1)

## 2021-10-26 LAB — ECHOCARDIOGRAM COMPLETE
AR max vel: 1.96 cm2
AV Area VTI: 2.04 cm2
AV Area mean vel: 1.91 cm2
AV Mean grad: 2.3 mmHg
AV Peak grad: 5.4 mmHg
Ao pk vel: 1.17 m/s
Area-P 1/2: 5.5 cm2
Height: 62 in
MV VTI: 1.44 cm2
S' Lateral: 3.95 cm
Weight: 2433.88 oz

## 2021-10-26 LAB — MAGNESIUM: Magnesium: 1.6 mg/dL — ABNORMAL LOW (ref 1.7–2.4)

## 2021-10-26 LAB — MRSA NEXT GEN BY PCR, NASAL: MRSA by PCR Next Gen: NOT DETECTED

## 2021-10-26 LAB — PROTIME-INR
INR: 2.2 — ABNORMAL HIGH (ref 0.8–1.2)
Prothrombin Time: 24.4 seconds — ABNORMAL HIGH (ref 11.4–15.2)

## 2021-10-26 LAB — APTT: aPTT: 31 seconds (ref 24–36)

## 2021-10-26 LAB — PHOSPHORUS: Phosphorus: 2.6 mg/dL (ref 2.5–4.6)

## 2021-10-26 LAB — HEMOGLOBIN A1C
Hgb A1c MFr Bld: 6.8 % — ABNORMAL HIGH (ref 4.8–5.6)
Mean Plasma Glucose: 148.46 mg/dL

## 2021-10-26 MED ORDER — APIXABAN 5 MG PO TABS
5.0000 mg | ORAL_TABLET | Freq: Two times a day (BID) | ORAL | Status: DC
  Administered 2021-10-26 – 2021-10-28 (×6): 5 mg via ORAL
  Filled 2021-10-26 (×6): qty 1

## 2021-10-26 MED ORDER — ACETAMINOPHEN 325 MG PO TABS
650.0000 mg | ORAL_TABLET | Freq: Four times a day (QID) | ORAL | Status: DC | PRN
  Administered 2021-10-26 (×2): 650 mg via ORAL
  Filled 2021-10-26 (×2): qty 2

## 2021-10-26 MED ORDER — POTASSIUM CHLORIDE CRYS ER 20 MEQ PO TBCR
40.0000 meq | EXTENDED_RELEASE_TABLET | Freq: Once | ORAL | Status: AC
  Administered 2021-10-26: 40 meq via ORAL
  Filled 2021-10-26: qty 2

## 2021-10-26 MED ORDER — METOPROLOL SUCCINATE ER 50 MG PO TB24
100.0000 mg | ORAL_TABLET | Freq: Two times a day (BID) | ORAL | Status: DC
  Administered 2021-10-26 – 2021-10-28 (×5): 100 mg via ORAL
  Filled 2021-10-26 (×5): qty 2

## 2021-10-26 MED ORDER — CHLORHEXIDINE GLUCONATE CLOTH 2 % EX PADS
6.0000 | MEDICATED_PAD | Freq: Every day | CUTANEOUS | Status: DC
  Administered 2021-10-26 – 2021-10-28 (×4): 6 via TOPICAL

## 2021-10-26 MED ORDER — MAGNESIUM SULFATE 2 GM/50ML IV SOLN
2.0000 g | Freq: Once | INTRAVENOUS | Status: AC
  Administered 2021-10-26: 2 g via INTRAVENOUS
  Filled 2021-10-26: qty 50

## 2021-10-26 MED ORDER — BUDESON-GLYCOPYRROL-FORMOTEROL 160-9-4.8 MCG/ACT IN AERO: 2.0000 | INHALATION_SPRAY | Freq: Two times a day (BID) | RESPIRATORY_TRACT | Status: DC

## 2021-10-26 MED ORDER — LIVING BETTER WITH HEART FAILURE BOOK: Freq: Once | Status: AC

## 2021-10-26 MED ORDER — POTASSIUM CHLORIDE CRYS ER 20 MEQ PO TBCR: 40.0000 meq | EXTENDED_RELEASE_TABLET | Freq: Three times a day (TID) | ORAL | Status: DC

## 2021-10-26 MED ORDER — POTASSIUM CHLORIDE CRYS ER 20 MEQ PO TBCR
40.0000 meq | EXTENDED_RELEASE_TABLET | Freq: Two times a day (BID) | ORAL | Status: AC
  Administered 2021-10-26 (×2): 40 meq via ORAL
  Filled 2021-10-26 (×2): qty 2

## 2021-10-26 MED ORDER — METOPROLOL SUCCINATE ER 50 MG PO TB24: 100.0000 mg | ORAL_TABLET | Freq: Every day | ORAL | Status: DC

## 2021-10-26 MED ORDER — UMECLIDINIUM BROMIDE 62.5 MCG/ACT IN AEPB
1.0000 | INHALATION_SPRAY | Freq: Every day | RESPIRATORY_TRACT | Status: DC
  Administered 2021-10-26 – 2021-10-28 (×3): 1 via RESPIRATORY_TRACT
  Filled 2021-10-26: qty 7

## 2021-10-26 MED ORDER — POTASSIUM CHLORIDE 10 MEQ/100ML IV SOLN
10.0000 meq | INTRAVENOUS | Status: AC
  Administered 2021-10-26 (×2): 10 meq via INTRAVENOUS
  Filled 2021-10-26 (×2): qty 100

## 2021-10-26 MED ORDER — DIGOXIN 0.25 MG/ML IJ SOLN
0.2500 mg | Freq: Three times a day (TID) | INTRAMUSCULAR | Status: AC
  Administered 2021-10-26 – 2021-10-27 (×3): 0.25 mg via INTRAVENOUS
  Filled 2021-10-26 (×3): qty 2

## 2021-10-26 MED ORDER — OMEGA-3-ACID ETHYL ESTERS 1 G PO CAPS
4000.0000 mg | ORAL_CAPSULE | Freq: Every day | ORAL | Status: DC
  Administered 2021-10-26 – 2021-10-28 (×3): 4000 mg via ORAL
  Filled 2021-10-26 (×3): qty 4

## 2021-10-26 MED ORDER — FUROSEMIDE 10 MG/ML IJ SOLN
40.0000 mg | Freq: Two times a day (BID) | INTRAMUSCULAR | Status: DC
  Administered 2021-10-26 – 2021-10-28 (×5): 40 mg via INTRAVENOUS
  Filled 2021-10-26 (×5): qty 4

## 2021-10-26 MED ORDER — ROSUVASTATIN CALCIUM 10 MG PO TABS
5.0000 mg | ORAL_TABLET | Freq: Every day | ORAL | Status: DC
  Administered 2021-10-26 – 2021-10-28 (×3): 5 mg via ORAL
  Filled 2021-10-26 (×3): qty 1

## 2021-10-26 MED ORDER — POLYETHYLENE GLYCOL 3350 17 G PO PACK: 17.0000 g | PACK | Freq: Every day | ORAL | Status: DC | PRN

## 2021-10-26 MED ORDER — METOPROLOL TARTRATE 25 MG PO TABS
25.0000 mg | ORAL_TABLET | Freq: Two times a day (BID) | ORAL | Status: DC
  Administered 2021-10-26: 25 mg via ORAL
  Filled 2021-10-26: qty 1

## 2021-10-26 MED ORDER — MOMETASONE FURO-FORMOTEROL FUM 100-5 MCG/ACT IN AERO
2.0000 | INHALATION_SPRAY | Freq: Two times a day (BID) | RESPIRATORY_TRACT | Status: DC
  Administered 2021-10-26 – 2021-10-28 (×5): 2 via RESPIRATORY_TRACT
  Filled 2021-10-26: qty 8.8

## 2021-10-26 MED ORDER — LEVOTHYROXINE SODIUM 100 MCG PO TABS
100.0000 ug | ORAL_TABLET | Freq: Every day | ORAL | Status: DC
  Administered 2021-10-26 – 2021-10-28 (×3): 100 ug via ORAL
  Filled 2021-10-26 (×3): qty 1

## 2021-10-26 NOTE — Evaluation (Addendum)
Physical Therapy Evaluation Patient Details Name: Diane Woods MRN: [id number] DOB: 10-26-1946 Today's Date: 10/26/2021  History of Present Illness  Diane Woods is a 75 y.o. female with medical history significant for atrial fibrillation on Eliquis, type II DM, essential hypertension, mixed hyperlipidemia, acquired hypothyroidism and chronic bronchitis (per patient) who presents to the emergency department due to 3-week onset of worsening shortness of breath, she went to her PCP and she was prescribed with antibiotics (cephalexin and azithromycin) with no improvement of symptoms.  She has not been able to lay flat in bed since the last 2 weeks due to worsening shortness of breath, so she has been sleeping on a recliner.  Patient also complained of recurrent falls due to leg weakness on ambulation within past 2 to 3 weeks, so she returned to her PCP this morning and she was asked to go to the ED for further evaluation and management.  She denies fever, chills, chest pain, nausea, vomiting or abdominal pain.   Clinical Impression  Patient functioning at baseline for functional mobility and gait other than mild unsteadiness on feet w/ object avoidance and mild shakiness d/t fear of falling and generalized weakness. Patient presents in bed awake, alert, and agreeable for therapy. Patient demonstrates independence for all bed mobility and transfers. Patient ambulated Mod independent in hallway w/ no near loss of balance. Patient tolerated sitting up in chair after therapy. Patient discharged to care of nursing for ambulation daily as tolerated for length of stay. Recommended venue below if needed after hospital stay to improve strength, balance, and decrease fear of falling.         Recommendations for follow up therapy are one component of a multi-disciplinary discharge planning process, led by the attending physician.  Recommendations may be updated based on patient status, additional functional  criteria and insurance authorization.  Follow Up Recommendations Home health PT    Assistance Recommended at Discharge PRN  Functional Status Assessment Patient has had a recent decline in their functional status and demonstrates the ability to make significant improvements in function in a reasonable and predictable amount of time.   Equipment Recommendations  None recommended by PT    Recommendations for Other Services       Precautions / Restrictions Precautions Precautions: None Restrictions Weight Bearing Restrictions: No      Mobility  Bed Mobility Overal bed mobility: Independent             General bed mobility comments: HOB flat    Transfers Overall transfer level: Independent Equipment used: None                    Ambulation/Gait Ambulation/Gait assistance: Modified independent (Device/Increase time) Gait Distance (Feet): 100 Feet Assistive device: None Gait Pattern/deviations: Step-through pattern Gait velocity: Normal     General Gait Details: mild shakiness d/t generalized weakness and fear of falling, mild unsteadiness on feet  Stairs            Wheelchair Mobility    Modified Rankin (Stroke Patients Only)       Balance Overall balance assessment: Needs assistance   Sitting balance-Leahy Scale: Good Sitting balance - Comments: seated at EOB   Standing balance support: No upper extremity supported;During functional activity Standing balance-Leahy Scale: Good Standing balance comment: occasionally slightly unsteady on feet when turning and avoiding obstacles  Pertinent Vitals/Pain Pain Assessment: 0-10 Pain Score: 4  Pain Location: head Pain Descriptors / Indicators: Headache Pain Intervention(s): Limited activity within patient's tolerance;Monitored during session    Diane Woods expects to be discharged to:: Private residence Living Arrangements:  Alone Available Help at Discharge: Family;Available PRN/intermittently Type of Home: House Home Access: Stairs to enter Entrance Stairs-Rails: Can reach both;Right;Left Entrance Stairs-Number of Steps: 1   Home Layout: One level Home Equipment: Shower seat - built in Additional Comments: Pt reports she could have someone stay with her if needed.    Prior Function Prior Level of Function : Needs assist       Physical Assist : ADLs (physical)   ADLs (physical): IADLs Mobility Comments: Independent household and community ambulator, reports leaning on furniture during ambulation within home when feeling weak. Drives. ADLs Comments: Pt reports independence with ADL's assisted by fmaily for IADL's.     Hand Dominance   Dominant Hand: Right    Extremity/Trunk Assessment   Upper Extremity Assessment Upper Extremity Assessment: Defer to OT evaluation    Lower Extremity Assessment Lower Extremity Assessment: Generalized weakness    Cervical / Trunk Assessment Cervical / Trunk Assessment: Normal  Communication   Communication: No difficulties  Cognition Arousal/Alertness: Awake/alert Behavior During Therapy: WFL for tasks assessed/performed Overall Cognitive Status: Within Functional Limits for tasks assessed                                          General Comments      Exercises     Assessment/Plan    PT Assessment All further PT needs can be met in the next venue of care  PT Problem List Decreased strength;Decreased balance;Decreased mobility;Decreased safety awareness       PT Treatment Interventions      PT Goals (Current goals can be found in the Care Plan section)  Acute Rehab PT Goals Patient Stated Goal: Return home. PT Goal Formulation: With patient Time For Goal Achievement: 10/29/21 Potential to Achieve Goals: Good    Frequency     Barriers to discharge        Co-evaluation PT/OT/SLP Co-Evaluation/Treatment: Yes Reason for  Co-Treatment: To address functional/ADL transfers PT goals addressed during session: Mobility/safety with mobility OT goals addressed during session: ADL's and self-care       AM-PAC PT "6 Clicks" Mobility  Outcome Measure Help needed turning from your back to your side while in a flat bed without using bedrails?: None Help needed moving from lying on your back to sitting on the side of a flat bed without using bedrails?: None Help needed moving to and from a bed to a chair (including a wheelchair)?: None Help needed standing up from a chair using your arms (e.g., wheelchair or bedside chair)?: None Help needed to walk in hospital room?: A Little Help needed climbing 3-5 steps with a railing? : None 6 Click Score: 23    End of Session   Activity Tolerance: Patient tolerated treatment well Patient left: in bed;with call bell/phone within reach Nurse Communication: Mobility status PT Visit Diagnosis: Unsteadiness on feet (R26.81);Other abnormalities of gait and mobility (R26.89);Muscle weakness (generalized) (M62.81)    Time: 7124-5809 PT Time Calculation (min) (ACUTE ONLY): 23 min   Charges:   PT Evaluation $PT Eval Moderate Complexity: 1 Mod PT Treatments $Therapeutic Activity: 23-37 mins        Cassie Jones,  SPT  During this treatment session, the therapist was present, participating in and directing the treatment.  11:29 AM, 10/26/21 Lonell Grandchild, MPT Physical Therapist with [facility] [place] [phone] office [phone] mobile phone

## 2021-10-26 NOTE — Progress Notes (Signed)
PROGRESS NOTE    Diane Woods  DZH:[id number] DOB: 1946/06/04 DOA: 10/25/2021 PCP: Neale Burly, MD   Brief Narrative:   Diane Woods is a 75 y.o. female with medical history significant for atrial fibrillation on Eliquis, type II DM, essential hypertension, mixed hyperlipidemia, acquired hypothyroidism and chronic bronchitis (per patient) who presents to the emergency department due to 3-week onset of worsening shortness of breath, she went to her PCP and she was prescribed with antibiotics (cephalexin and azithromycin) with no improvement of symptoms.  She was admitted with new onset congestive heart failure and 2D echocardiogram now shows LVEF 35-40% with global hypokinesis.  Patient continues to diurese with IV Lasix per cardiology recommendations.  She was also noted to have atrial fibrillation with RVR and remains on diltiazem drip with plans to wean off with oral medications as recommended per cardiology.  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  Mild transaminitis -Continue to follow in a.m. -Likely congestive hepatopathy, continue to monitor  Hypertension -Beta-blocker therapy per cardiology and hold benazepril to allow for titration of AV nodal blocking agents  Dyslipidemia -Fish oil and Crestor  Hypothyroidism -Levothyroxine  History of chronic bronchitis -Continue Breztri   DVT prophylaxis: Eliquis Code Status: Full Family Communication: None at bedside Disposition Plan:  Status is: Inpatient  Remains inpatient appropriate because: Requires ongoing IV medications.   Consultants:  Cardiology  Procedures:  See below  Antimicrobials:  None   Subjective: Patient seen and evaluated today with improvement in shortness of breath noted this morning.  She continues to have elevated heart rates and remains on Cardizem drip.  Objective: Vitals:   10/26/21 0630 10/26/21 0754 10/26/21 1005 10/26/21 1150  BP: (!) 159/110     Pulse: (!) 118     Resp: (!) 26     Temp:  97.7 F (36.5 C)  97.7 F (36.5 C)  TempSrc:  Oral  Oral  SpO2: 95%  97%   Weight:      Height:        Intake/Output Summary (Last 24 hours) at 10/26/2021 1310 Last data filed at 10/26/2021 0800 Gross per 24 hour  Intake 391.93 ml  Output 2350 ml  Net -1958.07 ml   Filed Weights   10/25/21 1039 10/26/21 0012 10/26/21 0500  Weight: 69 kg 70.8 kg 69 kg    Examination:  General exam: Appears calm and comfortable  Respiratory system: Clear to auscultation. Respiratory effort normal. Cardiovascular system: S1 & S2 heard, irregular and tachycardic Gastrointestinal system: Abdomen is soft Central nervous system: Alert and awake Extremities: No edema  Skin: No significant lesions noted Psychiatry: Flat affect.    Data Reviewed: I have personally reviewed following labs and imaging studies  CBC: Recent Labs  Lab 10/25/21 1202 10/26/21 0403  WBC 9.8  8.7  NEUTROABS 5.1  --   HGB 15.5* 14.6  HCT 48.2* 45.0  MCV 98.0 98.0  PLT 247 536   Basic Metabolic Panel: Recent Labs  Lab 10/25/21 1202 10/26/21 0403  NA 137 139  K 4.0 2.7*  CL 102 102  CO2 25 27  GLUCOSE 153* 153*  BUN 13 11  CREATININE 0.88 0.62  CALCIUM 8.6* 8.3*  MG  --  1.6*  PHOS  --  2.6   GFR: Estimated Creatinine Clearance: 54.5 mL/min (by C-G formula based on SCr of 0.62 mg/dL). Liver Function Tests: Recent Labs  Lab 10/25/21 1202 10/26/21 0403  AST 64* 65*  ALT 71* 76*  ALKPHOS 135* 131*  BILITOT 1.9* 1.9*  PROT 6.0* 5.8*  ALBUMIN 3.7 3.6   No results for input(s): LIPASE, AMYLASE in the last 168 hours. No results for input(s): AMMONIA in the last 168 hours. Coagulation Profile: Recent Labs  Lab 10/26/21 0403  INR 2.2*   Cardiac Enzymes: No results for input(s): CKTOTAL, CKMB, CKMBINDEX, TROPONINI in the last 168 hours. BNP (last 3 results) No results for input(s): PROBNP in the last 8760 hours. HbA1C: Recent Labs    10/25/21 1202  HGBA1C 6.8*   CBG: Recent Labs  Lab 10/25/21 2231 10/26/21 0714 10/26/21 1106 10/26/21 1236  GLUCAP 184* 127* 203* 186*   Lipid Profile: No results for input(s): CHOL, HDL, LDLCALC, TRIG, CHOLHDL, LDLDIRECT in the last 72 hours. Thyroid Function Tests: Recent Labs    10/25/21 1203  TSH 2.342   Anemia Panel: No results for input(s): VITAMINB12, FOLATE, FERRITIN, TIBC, IRON, RETICCTPCT in the last 72 hours. Sepsis Labs: No results for input(s): PROCALCITON, LATICACIDVEN in the last 168 hours.  Recent Results (from the past 240 hour(s))  Resp Panel by RT-PCR (Flu A&B, Covid) Nasopharyngeal Swab     Status: None   Collection Time: 10/25/21 12:13 PM   Specimen: Nasopharyngeal Swab; Nasopharyngeal(NP) swabs in vial transport medium  Result Value Ref Range Status   SARS Coronavirus 2 by RT PCR NEGATIVE NEGATIVE Final    Comment: (NOTE) SARS-CoV-2 target nucleic acids are NOT DETECTED.  The  SARS-CoV-2 RNA is generally detectable in upper respiratory specimens during the acute phase of infection. The lowest concentration of SARS-CoV-2 viral copies this assay can detect is 138 copies/mL. A negative result does not preclude SARS-Cov-2 infection and should not be used as the sole basis for treatment or other patient management decisions. A negative result may occur with  improper specimen collection/handling, submission of specimen other than nasopharyngeal swab, presence of viral mutation(s) within the areas targeted by this assay, and inadequate number of viral copies(<138 copies/mL). A negative result must be combined with clinical observations, patient history, and epidemiological information. The expected result is Negative.  Fact Sheet for Patients:  EntrepreneurPulse.com.au  Fact Sheet for Healthcare Providers:  IncredibleEmployment.be  This test is no t yet approved or cleared by the [place] FDA and  has been authorized for detection and/or diagnosis of SARS-CoV-2 by FDA under an Emergency Use Authorization (EUA). This EUA will remain  in effect (meaning this test can be used) for the duration of the COVID-19 declaration under Section 564(b)(1) of the Act, 21 U.S.C.section 360bbb-3(b)(1), unless the authorization is terminated  or revoked sooner.  Influenza A by PCR NEGATIVE NEGATIVE Final   Influenza B by PCR NEGATIVE NEGATIVE Final    Comment: (NOTE) The Xpert Xpress SARS-CoV-2/FLU/RSV plus assay is intended as an aid in the diagnosis of influenza from Nasopharyngeal swab specimens and should not be used as a sole basis for treatment. Nasal washings and aspirates are unacceptable for Xpert Xpress SARS-CoV-2/FLU/RSV testing.  Fact Sheet for Patients: EntrepreneurPulse.com.au  Fact Sheet for Healthcare Providers: IncredibleEmployment.be  This test is not yet approved or  cleared by the [place] FDA and has been authorized for detection and/or diagnosis of SARS-CoV-2 by FDA under an Emergency Use Authorization (EUA). This EUA will remain in effect (meaning this test can be used) for the duration of the COVID-19 declaration under Section 564(b)(1) of the Act, 21 U.S.C. section 360bbb-3(b)(1), unless the authorization is terminated or revoked.  Performed at [facility], [address] [place], [place] [address]   MRSA Next Gen by PCR, Nasal     Status: None   Collection Time: 10/26/21 12:04 AM   Specimen: Nasal Mucosa; Nasal Swab  Result Value Ref Range Status   MRSA by PCR Next Gen NOT DETECTED NOT DETECTED Final    Comment: (NOTE) The GeneXpert MRSA Assay (FDA approved for NASAL specimens only), is one component of a comprehensive MRSA colonization surveillance program. It is not intended to diagnose MRSA infection nor to guide or monitor treatment for MRSA infections. Test performance is not FDA approved in patients less than 26 years old. Performed at [facility], [address] [place], [place] [address]          Radiology Studies: CT Angio Chest PE W/Cm &/Or Wo Cm  Result Date: 10/25/2021 CLINICAL DATA:  Shortness of breath for 1 month with abdominal pain and abnormal LFTs. EXAM: CT ANGIOGRAPHY CHEST CT ABDOMEN AND PELVIS WITH CONTRAST TECHNIQUE: Multidetector CT imaging of the chest was performed using the standard protocol during bolus administration of intravenous contrast. Multiplanar CT image reconstructions and MIPs were obtained to evaluate the vascular anatomy. Multidetector CT imaging of the abdomen and pelvis was performed using the standard protocol during bolus administration of intravenous contrast. CONTRAST:  160mL OMNIPAQUE IOHEXOL 350 MG/ML SOLN COMPARISON:  Chest x-ray from earlier in the same day. FINDINGS: CTA CHEST FINDINGS Cardiovascular: Atherosclerotic calcifications of the thoracic aorta are noted. No aneurysmal  dilatation is seen. The heart is mildly enlarged in size. Significant reflux of contrast into the hepatic veins is noted likely related to increased central venous pressure. The pulmonary artery is well visualized within normal branching pattern bilaterally. No filling defect to suggest pulmonary embolism is seen. Coronary calcifications are noted. Mediastinum/Nodes: Thoracic inlet is within normal limits. No sizable hilar or mediastinal adenopathy is noted. The esophagus as visualized is within normal limits. Lungs/Pleura: Mild interstitial thickening is noted likely related to a degree of interstitial edema. No focal infiltrate or sizable effusion is seen. Some areas of mucous plugging are noted particularly in the lower lobes. No sizable parenchymal nodule is seen. Musculoskeletal: Degenerative changes of the thoracic spine are noted. No acute rib abnormality is seen. Review of the MIP images confirms the above findings. CT ABDOMEN and PELVIS FINDINGS Hepatobiliary: Liver demonstrates findings suggestive of fatty infiltration. Increased enhancement is noted within the left lobe related to previously seen venous reflux. Gallbladder is partially distended. No complicating factors are seen. Pancreas: Unremarkable. No pancreatic ductal dilatation or surrounding inflammatory changes. Spleen: Normal in size without focal abnormality. Adrenals/Urinary Tract: Adrenal glands are within  normal limits. Kidneys demonstrate a normal enhancement pattern bilaterally. Right kidney is mildly ptotic. No obstructive changes are seen. Calculi are noted. The bladder is decompressed. Stomach/Bowel: The appendix is well visualized and within normal limits. No obstructive or inflammatory changes of the colon are seen. Small bowel and stomach appear within normal limits. Vascular/Lymphatic: Aortic atherosclerosis. No enlarged abdominal or pelvic lymph nodes. Reproductive: Multiple calcified uterine fibroids are noted. Simple appearing  left ovarian cyst measuring 2.6 cm is noted. Other: No abdominal wall hernia or abnormality. No abdominopelvic ascites. Musculoskeletal: Degenerative changes of lumbar spine are seen. Review of the MIP images confirms the above findings. IMPRESSION: CTA of the chest: No evidence of pulmonary emboli. Findings suspicious for increased central venous pressure with hepatic venous reflux. Mild changes of interstitial edema. CT of the abdomen and pelvis: Fatty infiltration of the liver. Multiple calcified uterine fibroids. Simple appearing 2.6 cm left ovarian cyst. No follow-up imaging recommended. Note: This recommendation does not apply to premenarchal patients and to those with increased risk (genetic, family history, elevated tumor markers or other high-risk factors) of ovarian cancer. Reference: JACR 2020 Feb; 17(2):248-254 Aortic Atherosclerosis (ICD10-I70.0). Electronically Signed   By: Inez Catalina M.D.   On: 10/25/2021 18:18   CT Abdomen Pelvis W Contrast  Result Date: 10/25/2021 CLINICAL DATA:  Shortness of breath for 1 month with abdominal pain and abnormal LFTs. EXAM: CT ANGIOGRAPHY CHEST CT ABDOMEN AND PELVIS WITH CONTRAST TECHNIQUE: Multidetector CT imaging of the chest was performed using the standard protocol during bolus administration of intravenous contrast. Multiplanar CT image reconstructions and MIPs were obtained to evaluate the vascular anatomy. Multidetector CT imaging of the abdomen and pelvis was performed using the standard protocol during bolus administration of intravenous contrast. CONTRAST:  172mL OMNIPAQUE IOHEXOL 350 MG/ML SOLN COMPARISON:  Chest x-ray from earlier in the same day. FINDINGS: CTA CHEST FINDINGS Cardiovascular: Atherosclerotic calcifications of the thoracic aorta are noted. No aneurysmal dilatation is seen. The heart is mildly enlarged in size. Significant reflux of contrast into the hepatic veins is noted likely related to increased central venous pressure. The  pulmonary artery is well visualized within normal branching pattern bilaterally. No filling defect to suggest pulmonary embolism is seen. Coronary calcifications are noted. Mediastinum/Nodes: Thoracic inlet is within normal limits. No sizable hilar or mediastinal adenopathy is noted. The esophagus as visualized is within normal limits. Lungs/Pleura: Mild interstitial thickening is noted likely related to a degree of interstitial edema. No focal infiltrate or sizable effusion is seen. Some areas of mucous plugging are noted particularly in the lower lobes. No sizable parenchymal nodule is seen. Musculoskeletal: Degenerative changes of the thoracic spine are noted. No acute rib abnormality is seen. Review of the MIP images confirms the above findings. CT ABDOMEN and PELVIS FINDINGS Hepatobiliary: Liver demonstrates findings suggestive of fatty infiltration. Increased enhancement is noted within the left lobe related to previously seen venous reflux. Gallbladder is partially distended. No complicating factors are seen. Pancreas: Unremarkable. No pancreatic ductal dilatation or surrounding inflammatory changes. Spleen: Normal in size without focal abnormality. Adrenals/Urinary Tract: Adrenal glands are within normal limits. Kidneys demonstrate a normal enhancement pattern bilaterally. Right kidney is mildly ptotic. No obstructive changes are seen. Calculi are noted. The bladder is decompressed. Stomach/Bowel: The appendix is well visualized and within normal limits. No obstructive or inflammatory changes of the colon are seen. Small bowel and stomach appear within normal limits. Vascular/Lymphatic: Aortic atherosclerosis. No enlarged abdominal or pelvic lymph nodes. Reproductive: Multiple calcified uterine fibroids are  noted. Simple appearing left ovarian cyst measuring 2.6 cm is noted. Other: No abdominal wall hernia or abnormality. No abdominopelvic ascites. Musculoskeletal: Degenerative changes of lumbar spine are  seen. Review of the MIP images confirms the above findings. IMPRESSION: CTA of the chest: No evidence of pulmonary emboli. Findings suspicious for increased central venous pressure with hepatic venous reflux. Mild changes of interstitial edema. CT of the abdomen and pelvis: Fatty infiltration of the liver. Multiple calcified uterine fibroids. Simple appearing 2.6 cm left ovarian cyst. No follow-up imaging recommended. Note: This recommendation does not apply to premenarchal patients and to those with increased risk (genetic, family history, elevated tumor markers or other high-risk factors) of ovarian cancer. Reference: JACR 2020 Feb; 17(2):248-254 Aortic Atherosclerosis (ICD10-I70.0). Electronically Signed   By: Inez Catalina M.D.   On: 10/25/2021 18:18   DG Chest Port 1 View  Result Date: 10/25/2021 CLINICAL DATA:  Shortness of breath EXAM: PORTABLE CHEST 1 VIEW COMPARISON:  None. FINDINGS: Cardiomegaly. Atherosclerotic calcification of the aortic knob. Mild diffuse interstitial prominence. No lobar consolidation. No pleural effusion or pneumothorax. Surgical clips in the right axillary region. IMPRESSION: Cardiomegaly with diffuse interstitial prominence suggesting mild edema. Electronically Signed   By: Davina Poke D.O.   On: 10/25/2021 12:32   ECHOCARDIOGRAM COMPLETE  Result Date: 10/26/2021    ECHOCARDIOGRAM REPORT   Patient Name:   Diane Woods Date of Exam: 10/26/2021 Medical Rec #:  [id number]      Height:       62.0 in Accession #:    [id number]     Weight:       152.1 lb Date of Birth:  [birth year]      BSA:          1.702 m Patient Age:    26 years       BP:           159/110 mmHg Patient Gender: F              HR:           96 bpm. Exam Location:  [facility] Procedure: 3D Echo, Cardiac Doppler and Color Doppler Indications:    CHF-Acute Diastolic  History:        Patient has prior history of Echocardiogram examinations, most                 recent 04/29/2011. CHF, Arrythmias:Atrial  Fibrillation and                 Tachycardia; Risk Factors:Hypertension, Diabetes and                 Dyslipidemia.  Sonographer:    Wenda Low Referring Phys: [id number] OLADAPO ADEFESO IMPRESSIONS  1. Left ventricular ejection fraction, by estimation, is 35 to 40%. The left ventricle has moderately decreased function. The left ventricle demonstrates global hypokinesis. There is mild left ventricular hypertrophy. Left ventricular diastolic parameters are indeterminate.  2. Right ventricular systolic function is mildly reduced. The right ventricular size is normal. There is mildly elevated pulmonary artery systolic pressure. The estimated right ventricular systolic pressure is 36.1 mmHg.  3. Left atrial size was moderately dilated.  4. Right atrial size was moderately dilated.  5. The mitral valve is grossly normal. Mild mitral valve regurgitation.  6. Tricuspid valve regurgitation is moderate.  7. The aortic valve is tricuspid. Aortic valve regurgitation is mild. Aortic valve mean gradient measures 2.3 mmHg.  8. The inferior vena cava is dilated in size  with <50% respiratory variability, suggesting right atrial pressure of 15 mmHg. Comparison(s): Prior images unable to be directly viewed, comparison made by report only. LVEF has decreased in comparison to outside echocardiogram report from March 2022. FINDINGS  Left Ventricle: Left ventricular ejection fraction, by estimation, is 35 to 40%. The left ventricle has moderately decreased function. The left ventricle demonstrates global hypokinesis. The left ventricular internal cavity size was normal in size. There is mild left ventricular hypertrophy. Left ventricular diastolic parameters are indeterminate. Right Ventricle: The right ventricular size is normal. No increase in right ventricular wall thickness. Right ventricular systolic function is mildly reduced. There is mildly elevated pulmonary artery systolic pressure. The tricuspid regurgitant velocity  is  2.60 m/s, and with an assumed right atrial pressure of 15 mmHg, the estimated right ventricular systolic pressure is 41.2 mmHg. Left Atrium: Left atrial size was moderately dilated. Right Atrium: Right atrial size was moderately dilated. Pericardium: There is no evidence of pericardial effusion. Mitral Valve: The mitral valve is grossly normal. Mild mitral annular calcification. Mild mitral valve regurgitation. MV peak gradient, 6.2 mmHg. The mean mitral valve gradient is 2.0 mmHg. Tricuspid Valve: The tricuspid valve is grossly normal. Tricuspid valve regurgitation is moderate. Aortic Valve: The aortic valve is tricuspid. There is mild aortic valve annular calcification. Aortic valve regurgitation is mild. Aortic valve mean gradient measures 2.3 mmHg. Aortic valve peak gradient measures 5.4 mmHg. Aortic valve area, by VTI measures 2.04 cm. Pulmonic Valve: The pulmonic valve was grossly normal. Pulmonic valve regurgitation is trivial. Aorta: The aortic root is normal in size and structure. Venous: The inferior vena cava is dilated in size with less than 50% respiratory variability, suggesting right atrial pressure of 15 mmHg. IAS/Shunts: No atrial level shunt detected by color flow Doppler.  LEFT VENTRICLE PLAX 2D LVIDd:         4.90 cm   Diastology LVIDs:         3.95 cm   LV e' medial:    8.92 cm/s LV PW:         1.00 cm   LV E/e' medial:  13.0 LV IVS:        1.10 cm   LV e' lateral:   11.90 cm/s LVOT diam:     2.10 cm   LV E/e' lateral: 9.7 LV SV:         37 LV SV Index:   22 LVOT Area:     3.46 cm  RIGHT VENTRICLE RV Basal diam:  4.30 cm RV Mid diam:    3.60 cm RV S prime:     13.80 cm/s TAPSE (M-mode): 1.7 cm LEFT ATRIUM             Index        RIGHT ATRIUM           Index LA diam:        4.90 cm 2.88 cm/m   RA Area:     22.50 cm LA Vol (A2C):   55.3 ml 32.50 ml/m  RA Volume:   61.60 ml  36.20 ml/m LA Vol (A4C):   68.2 ml 40.08 ml/m LA Biplane Vol: 64.6 ml 37.96 ml/m  AORTIC VALVE                     PULMONIC VALVE AV Area (Vmax):    1.96 cm     PV Vmax:       0.54 m/s AV Area (Vmean):   1.91 cm  PV Peak grad:  1.1 mmHg AV Area (VTI):     2.04 cm AV Vmax:           116.67 cm/s AV Vmean:          75.067 cm/s AV VTI:            0.180 m AV Peak Grad:      5.4 mmHg AV Mean Grad:      2.3 mmHg LVOT Vmax:         66.10 cm/s LVOT Vmean:        41.500 cm/s LVOT VTI:          0.106 m LVOT/AV VTI ratio: 0.59  AORTA Ao Root diam: 2.60 cm Ao Asc diam:  3.10 cm MITRAL VALVE                TRICUSPID VALVE MV Area (PHT): 5.50 cm     TR Peak grad:   27.0 mmHg MV Area VTI:   1.44 cm     TR Vmax:        260.00 cm/s MV Peak grad:  6.2 mmHg MV Mean grad:  2.0 mmHg     SHUNTS MV Vmax:       1.24 m/s     Systemic VTI:  0.11 m MV Vmean:      63.3 cm/s    Systemic Diam: 2.10 cm MV Decel Time: 138 msec MV E velocity: 116.00 cm/s Rozann Lesches MD Electronically signed by Rozann Lesches MD Signature Date/Time: 10/26/2021/10:44:03 AM    Final         Scheduled Meds:  apixaban  5 mg Oral BID   Chlorhexidine Gluconate Cloth  6 each Topical Daily   digoxin  0.25 mg Intravenous Q8H   furosemide  40 mg Intravenous BID   insulin aspart  0-5 Units Subcutaneous QHS   insulin aspart  0-9 Units Subcutaneous TID WC   levothyroxine  100 mcg Oral Q0600   Living Better with Heart Failure Book   Does not apply Once   metoprolol succinate  100 mg Oral BID   mometasone-formoterol  2 puff Inhalation BID   omega-3 acid ethyl esters  4,000 mg Oral Daily   potassium chloride  40 mEq Oral BID   rosuvastatin  5 mg Oral Daily   umeclidinium bromide  1 puff Inhalation Daily   Continuous Infusions:  diltiazem (CARDIZEM) infusion 15 mg/hr (10/26/21 0328)     LOS: 1 day    Time spent: 35 minutes    Lynasia Meloche Darleen Crocker, DO [facility]  If 7PM-7AM, please contact night-coverage [email or web address] 10/26/2021, 1:10 PM

## 2021-10-26 NOTE — Progress Notes (Signed)
0841 Patient  working with PT, unavailable for nebulizer at this time. Will attempt administration of nebulizer at a later time.

## 2021-10-26 NOTE — Progress Notes (Signed)
*  PRELIMINARY RESULTS* Echocardiogram 2D Echocardiogram has been performed.  Diane Woods 10/26/2021, 9:35 AM

## 2021-10-26 NOTE — TOC Initial Note (Signed)
Transition of Care [facility]) - Initial/Assessment Note    Patient Details  Name: Diane Woods MRN: [id number] Date of Birth: 03/04/1946  Transition of Care [facility]) CM/SW Contact:    Shade Flood, LCSW Phone Number: 10/26/2021, 1:08 PM  Clinical Narrative:                  Pt admitted from home. Received TOC consult for HF screen. PT recommending HHPT at dc. Met with pt to assess and review dc planning. Per pt, she lives alone and is independent in ADLs at baseline. Pt's son assists when needed. Per pt, she follows a heart healthy diet. She does not weigh daily but states she will start doing this. CHF book will be given to pt by RN for her reference.  Pt states that her PCP office recently referred her for HHPT but it hasn't started yet. It appears that [facility] has this referral and plan was to start Monday. Eric at [facility] updated today. Will notify [facility] once dc date known.  Pt states she has all needed DME at home. She is not anticipating any other TOC needs for dc.  TOC will follow and continue to assist with dc planning.  Expected Discharge Plan: [place] Barriers to Discharge: Continued Medical Work up   Patient Goals and CMS Choice Patient states their goals for this hospitalization and ongoing recovery are:: go home CMS Medicare.gov Compare Post Acute Care list provided to:: Patient Choice offered to / list presented to : Patient  Expected Discharge Plan and Services Expected Discharge Plan: [place] In-house Referral: Clinical Social Work   Post Acute Care Choice: Home Health, Resumption of Svcs/PTA Provider Living arrangements for the past 2 months: Apartment                           HH Arranged: Therapist, sports, PT [place] Agency: [place] Date [place]: 10/26/21   Representative spoke with at [place]: Randall Hiss  Prior Living Arrangements/Services Living arrangements for the past 2 months:  [place] with:: Self Patient language and need for interpreter reviewed:: Yes Do you feel safe going back to the place where you live?: Yes      Need for Family Participation in Patient Care: No (Comment) Care giver support system in place?: Yes (comment) Current home services: DME, Home PT Criminal Activity/Legal Involvement Pertinent to Current Situation/Hospitalization: No - Comment as needed  Activities of Daily Living Home Assistive Devices/Equipment: None ADL Screening (condition at time of admission) Patient's cognitive ability adequate to safely complete daily activities?: Yes Is the patient deaf or have difficulty hearing?: No Does the patient have difficulty seeing, even when wearing glasses/contacts?: No Does the patient have difficulty concentrating, remembering, or making decisions?: No Patient able to express need for assistance with ADLs?: Yes Does the patient have difficulty dressing or bathing?: No Independently performs ADLs?: Yes (appropriate for developmental age) Does the patient have difficulty walking or climbing stairs?: No Weakness of Legs: Both Weakness of Arms/Hands: None  Permission Sought/Granted Permission sought to share information with : Facility Art therapist granted to share information with : Yes, Verbal Permission Granted     Permission granted to share info w AGENCY: HH        Emotional Assessment   Attitude/Demeanor/Rapport: Engaged Affect (typically observed): Pleasant Orientation: : Oriented to Self, Oriented to Place, Oriented to  Time, Oriented to Situation Alcohol /  Substance Use: Not Applicable Psych Involvement: No (comment)  Admission diagnosis:  CHF (congestive heart failure) (HCC) [I50.9] SOB (shortness of breath) [R06.02] Atrial flutter with rapid ventricular response (HCC) [I48.92] Abnormal LFTs [R79.89] Patient Active Problem List   Diagnosis Date Noted   CHF (congestive heart failure) ([place])  10/25/2021   Hyperglycemia due to diabetes mellitus ([place]) 10/25/2021   Acquired hypothyroidism 10/25/2021   Falls 10/25/2021   Transaminitis 10/25/2021   Elevated brain natriuretic peptide (BNP) level 10/25/2021   History of chronic bronchitis 10/25/2021   Prolonged QT interval 10/25/2021   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 [place] [address] Phone: [phone] Fax: [phone]     Social Determinants of Health (SDOH) Interventions    Readmission Risk Interventions Readmission Risk Prevention Plan 10/26/2021  Medication Screening Complete  Transportation Screening Complete  Some recent data might be hidden

## 2021-10-26 NOTE — Plan of Care (Signed)

## 2021-10-26 NOTE — Plan of Care (Signed)
  Problem: Education: Goal: Ability to demonstrate management of disease process will improve Outcome: Progressing Goal: Ability to verbalize understanding of medication therapies will improve Outcome: Progressing Goal: Individualized Educational Video(s) Outcome: Progressing   Problem: Activity: Goal: Capacity to carry out activities will improve Outcome: Progressing   Problem: Cardiac: Goal: Ability to achieve and maintain adequate cardiopulmonary perfusion will improve Outcome: Progressing   Problem: Education: Goal: Ability to demonstrate management of disease process will improve Outcome: Progressing Goal: Ability to verbalize understanding of medication therapies will improve Outcome: Progressing Goal: Individualized Educational Video(s) Outcome: Progressing   Problem: Activity: Goal: Capacity to carry out activities will improve Outcome: Progressing   Problem: Cardiac: Goal: Ability to achieve and maintain adequate cardiopulmonary perfusion will improve Outcome: Progressing   

## 2021-10-26 NOTE — Consult Note (Addendum)
Cardiology Consultation:   Patient ID: Diane Woods MRN: [id number]; DOB: Nov 07, 1946  Admit date: 10/25/2021 Date of Consult: 10/26/2021  PCP:  Neale Burly, MD   [place] Providers Cardiologist:  Dr. Candis Musa at [facility]  Patient Profile:   Diane Woods is a 75 y.o. female with a hx of SVT (s/p EP study in 05/2011 with atrial fibrillation during the study and started on Atenolol and Flecainide), permanent atrial fibrillation, HTN, HLD, Type 2 DM and hypothyroidism who is being seen 10/26/2021 for the evaluation of CHF at the request of Dr. Josephine Cables.  History of Present Illness:   Diane Woods was last examined by [facility] in 2012 and underwent an EP study as outlined above. In the interim, she has been followed by [facility] with her last visit in 05/2021 and was in persistent atrial fibrillation with intermittent flutter at that time. She was placed on a Zio patch which showed her HR averaged in the 80's, therefore she was continued on Toprol-XL 150mg  in AM/100mg  in PM and Eliquis 5mg  BID for anticoagulation. By review of notes, she was on Amiodarone 200mg  daily at some point but was not taking at her most recent visit (likely discontinued due to rate-control strategy and severe LA dilation by prior echo).  She presented to [facility] ED on 10/25/2021 for evaluation of worsening dyspnea for the past month. In talking the patient today, she reports actually having worsening dyspnea and fatigue since her prior COVID-19 diagnosis over 2 years ago. Says she never returned to her prior baseline. Over the past month, she does report worsening dyspnea on exertion along with orthopnea and PND. She has been sleeping in her recliner over the past several weeks. Says that her weight has been stable but she thought this was odd as she reports a decreased appetite. Has noticed abdominal distention but no lower extremity edema. No reported chest pain or palpitations. She does report  good compliance with her current cardiac medications. She was taking Toprol-XL, Eliquis and Benazepril prior to admission but was not on a diuretic. Was being treated for bronchitis by her PCP with inhalers but had not experienced improvement in her symptoms.   Initial labs show WBC 9.8, Hgb 15.5, platelets 247, Na+ 137, K+ 4.0 and creatinine 0.88. AST 64 and ALT 71. BNP 1118. Initial Hs Troponin 10 with repeat of 7. TSH 2.342. CXR showing cardiomegaly with mild edema. CTA showed no evidence of a PE but was noted to have increased central venous pressure and interstitial edema. Also noted to have fatty infiltration of the liver, uterine fibroids and a 2.6 cm left ovarian cyst. EKG showing atrial flutter with RVR, HR 127.  She was started on IV Cardizem for rate-control and on Lasix 40mg  BID for diuresis. Recorded output of -1.9 L thus far. Repeat labs this AM show creatinine remains stable at 0.62 with K+ low at 2.7 and replacement has been ordered.    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 Authorizing Provider  ALPRAZolam Duanne Moron) 0.5 MG tablet Take 0.5 mg by mouth at bedtime as needed.     Yes [provider]  azelastine (ASTELIN) 137 MCG/SPRAY nasal spray 1 spray by Nasal route 2 (two) times daily. Use in each nostril as directed    Yes [provider]  benazepril (LOTENSIN) 20 MG tablet Take 20 mg by mouth 2 (two) times daily. 10/15/21  Yes [provider]  BREZTRI AEROSPHERE  160-9-4.8 MCG/ACT AERO Inhale 2 puffs into the lungs 2 (two) times daily. 09/24/21  Yes [provider]  ELIQUIS 5 MG TABS tablet Take 5 mg by mouth 2 (two) times daily. 10/02/21  Yes [provider]  fish oil-omega-3 fatty acids 1000 MG capsule Take 1,200 mg by mouth daily. Take 4 tablets by mouth daily.    Yes [provider]  FLUoxetine (PROZAC) 40 MG capsule Take 40 mg by mouth daily.     Yes [provider]  glimepiride (AMARYL) 2 MG tablet Take 2 mg by mouth daily. 08/11/21  Yes [provider]  levothyroxine (SYNTHROID) 100 MCG tablet Take 100 mcg by mouth every morning. 09/28/21  Yes [provider]  loratadine (CLARITIN) 10 MG tablet Take 10 mg by mouth daily.     Yes [provider]  metoprolol succinate (TOPROL-XL) 100 MG 24 hr tablet Take 100-150 mg by mouth 2 (two) times daily. 150mg  in the morning and 100 mg at night 07/19/21  Yes [provider]  oxyCODONE-acetaminophen (PERCOCET) 10-325 MG per tablet Take 1 tablet by mouth every 4 (four) hours as needed.     Yes [provider]  rosuvastatin (CRESTOR) 10 MG tablet Take 5 mg by mouth daily.   Yes [provider]  zolpidem (AMBIEN) 10 MG tablet Take 10 mg by mouth at bedtime as needed.     Yes [provider]  atenolol (TENORMIN) 50 MG tablet Take 1 tablet (50 mg total) by mouth daily. Patient not taking: Reported on 10/25/2021 07/09/11   Thompson Grayer, MD  diclofenac (VOLTAREN) 75 MG EC tablet Take 75 mg by mouth 2 (two) times daily.   Patient not taking: Reported on 10/25/2021    [provider]  fenofibrate (TRICOR) 145 MG tablet Take 145 mg by mouth daily.   Patient not taking: Reported on 10/25/2021    [provider]  flecainide (TAMBOCOR) 50 MG tablet Take 1 tablet (50 mg total) by mouth 2 (two) times daily. Patient not taking: Reported on 10/25/2021 07/09/11   Thompson Grayer, MD    Inpatient Medications: Scheduled  Meds:  apixaban  5 mg Oral BID   Chlorhexidine Gluconate Cloth  6 each Topical Daily   furosemide  40 mg Intravenous BID   insulin aspart  0-5 Units Subcutaneous QHS   insulin aspart  0-9 Units Subcutaneous TID WC   levothyroxine  100 mcg Oral Q0600   mometasone-formoterol  2 puff Inhalation BID   omega-3 acid ethyl esters  4,000 mg Oral Daily   potassium chloride  40 mEq Oral BID   rosuvastatin  5 mg Oral Daily   umeclidinium bromide  1 puff Inhalation Daily   Continuous Infusions:  diltiazem (CARDIZEM) infusion 15 mg/hr (10/26/21 0328)   magnesium sulfate bolus IVPB     potassium chloride 10 mEq (10/26/21 7341)   Allergies:    Allergies  Allergen Reactions   Penicillins Swelling    THROAT SWELLING  Sulfa Antibiotics Rash    Social History:   Social History   Tobacco Use   Smoking status: Never   Smokeless tobacco: Never  Substance Use Topics   Alcohol use: No     Family History:    Family History  Problem Relation Age of Onset   Coronary artery disease Other      ROS:  Please see the history of present illness.  All other ROS reviewed and negative.     Physical Exam/Data:   Vitals:   10/26/21 0530 10/26/21 0600 10/26/21 0630 10/26/21 0754  BP: 123/81 (!) 146/105 (!) 159/110   Pulse: 71 (!) 115 (!) 118   Resp: (!) 23 17 (!) 26   Temp:    97.7 F (36.5 C)  TempSrc:    Oral  SpO2: 90% 95% 95%   Weight:      Height:        Intake/Output Summary (Last 24 hours) at 10/26/2021 0810 Last data filed at 10/26/2021 0500 Gross per 24 hour  Intake 391.93 ml  Output 1750 ml  Net -1358.07 ml   Last 3 Weights 10/26/2021 10/26/2021 10/25/2021  Weight (lbs) 152 lb 1.9 oz 156 lb 1.4 oz 152 lb 1.9 oz  Weight (kg) 69 kg 70.8 kg 69 kg     Body mass index is 27.82 kg/m.  General:  Well nourished, well developed female appearing in no acute distress. HEENT: normal Neck: no JVD Vascular: No carotid bruits; Distal pulses 2+ bilaterally Cardiac:  normal S1, S2;  Irregularly irregular. 2/6 systolic murmur along Apex.  Lungs: rales along bases bilaterally.  Abd: soft, nontender, no hepatomegaly  Ext: no pitting edema Musculoskeletal:  No deformities, BUE and BLE strength normal and equal Skin: warm and dry  Neuro:  CNs 2-12 intact, no focal abnormalities noted Psych:  Normal affect   EKG:  The EKG was personally reviewed and demonstrates: Atrial flutter with RVR, HR 127.  Telemetry:  Telemetry was personally reviewed and demonstrates: Most consistent with atrial fibrillation, HR in 80's to 90's overnight, in the 110's this AM.   Relevant CV Studies:  Echocardiogram: 02/2021 Summary    1. The left ventricle is normal in size with mildly increased wall  thickness.    2. The left ventricular systolic function is normal, LVEF is visually  estimated at 55%.    3. There is mild to moderate mitral valve regurgitation.    4. There is mild aortic regurgitation.    5. The left atrium is severely dilated in size.    6. The right ventricle is normal in size, with low normal systolic function.    7. There is mild to moderate tricuspid regurgitation.    8. There is no pulmonary hypertension, estimated pulmonary artery systolic  pressure is 30 mmHg.    9. The right atrium is severely dilated  in size.    Zio Monitor: 02/2021  Agree with Findings. Patient with 100% afib burden with overall good rate control. No pathological pauses or other arrhythmia present. No patient triggered events or reported symptoms.  Laboratory Data:  High Sensitivity Troponin:   Recent Labs  Lab 10/25/21 1202 10/25/21 1408  TROPONINIHS 10 7     Chemistry Recent Labs  Lab 10/25/21 1202 10/26/21 0403  NA 137 139  K 4.0 2.7*  CL 102 102  CO2 25 27  GLUCOSE 153* 153*  BUN 13 11  CREATININE 0.88 0.62  CALCIUM 8.6* 8.3*  MG  --  1.6*  GFRNONAA >  60 >60  ANIONGAP 10 10    Recent Labs  Lab 10/25/21 1202 10/26/21 0403  PROT 6.0* 5.8*  ALBUMIN 3.7 3.6  AST 64*  65*  ALT 71* 76*  ALKPHOS 135* 131*  BILITOT 1.9* 1.9*    Hematology Recent Labs  Lab 10/25/21 1202 10/26/21 0403  WBC 9.8 8.7  RBC 4.92 4.59  HGB 15.5* 14.6  HCT 48.2* 45.0  MCV 98.0 98.0  MCH 31.5 31.8  MCHC 32.2 32.4  RDW 15.9* 15.9*  PLT 247 215   Thyroid  Recent Labs  Lab 10/25/21 1203  TSH 2.342    BNP Recent Labs  Lab 10/25/21 1203  BNP 1,118.0*     Radiology/Studies:  CT Angio Chest PE W/Cm &/Or Wo Cm  Result Date: 10/25/2021 CLINICAL DATA:  Shortness of breath for 1 month with abdominal pain and abnormal LFTs. EXAM: CT ANGIOGRAPHY CHEST CT ABDOMEN AND PELVIS WITH CONTRAST TECHNIQUE: Multidetector CT imaging of the chest was performed using the standard protocol during bolus administration of intravenous contrast. Multiplanar CT image reconstructions and MIPs were obtained to evaluate the vascular anatomy. Multidetector CT imaging of the abdomen and pelvis was performed using the standard protocol during bolus administration of intravenous contrast. CONTRAST:  149mL OMNIPAQUE IOHEXOL 350 MG/ML SOLN COMPARISON:  Chest x-ray from earlier in the same day. FINDINGS: CTA CHEST FINDINGS Cardiovascular: Atherosclerotic calcifications of the thoracic aorta are noted. No aneurysmal dilatation is seen. The heart is mildly enlarged in size. Significant reflux of contrast into the hepatic veins is noted likely related to increased central venous pressure. The pulmonary artery is well visualized within normal branching pattern bilaterally. No filling defect to suggest pulmonary embolism is seen. Coronary calcifications are noted. Mediastinum/Nodes: Thoracic inlet is within normal limits. No sizable hilar or mediastinal adenopathy is noted. The esophagus as visualized is within normal limits. Lungs/Pleura: Mild interstitial thickening is noted likely related to a degree of interstitial edema. No focal infiltrate or sizable effusion is seen. Some areas of mucous plugging are noted  particularly in the lower lobes. No sizable parenchymal nodule is seen. Musculoskeletal: Degenerative changes of the thoracic spine are noted. No acute rib abnormality is seen. Review of the MIP images confirms the above findings. CT ABDOMEN and PELVIS FINDINGS Hepatobiliary: Liver demonstrates findings suggestive of fatty infiltration. Increased enhancement is noted within the left lobe related to previously seen venous reflux. Gallbladder is partially distended. No complicating factors are seen. Pancreas: Unremarkable. No pancreatic ductal dilatation or surrounding inflammatory changes. Spleen: Normal in size without focal abnormality. Adrenals/Urinary Tract: Adrenal glands are within normal limits. Kidneys demonstrate a normal enhancement pattern bilaterally. Right kidney is mildly ptotic. No obstructive changes are seen. Calculi are noted. The bladder is decompressed. Stomach/Bowel: The appendix is well visualized and within normal limits. No obstructive or inflammatory changes of the colon are seen. Small bowel and stomach appear within normal limits. Vascular/Lymphatic: Aortic atherosclerosis. No enlarged abdominal or pelvic lymph nodes. Reproductive: Multiple calcified uterine fibroids are noted. Simple appearing left ovarian cyst measuring 2.6 cm is noted. Other: No abdominal wall hernia or abnormality. No abdominopelvic ascites. Musculoskeletal: Degenerative changes of lumbar spine are seen. Review of the MIP images confirms the above findings. IMPRESSION: CTA of the chest: No evidence of pulmonary emboli. Findings suspicious for increased central venous pressure with hepatic venous reflux. Mild changes of interstitial edema. CT of the abdomen and pelvis: Fatty infiltration of the liver. Multiple calcified uterine fibroids. Simple appearing 2.6 cm left ovarian cyst. No follow-up  imaging recommended. Note: This recommendation does not apply to premenarchal patients and to those with increased risk (genetic,  family history, elevated tumor markers or other high-risk factors) of ovarian cancer. Reference: JACR 2020 Feb; 17(2):248-254 Aortic Atherosclerosis (ICD10-I70.0). Electronically Signed   By: Inez Catalina M.D.   On: 10/25/2021 18:18   CT Abdomen Pelvis W Contrast  Result Date: 10/25/2021 CLINICAL DATA:  Shortness of breath for 1 month with abdominal pain and abnormal LFTs. EXAM: CT ANGIOGRAPHY CHEST CT ABDOMEN AND PELVIS WITH CONTRAST TECHNIQUE: Multidetector CT imaging of the chest was performed using the standard protocol during bolus administration of intravenous contrast. Multiplanar CT image reconstructions and MIPs were obtained to evaluate the vascular anatomy. Multidetector CT imaging of the abdomen and pelvis was performed using the standard protocol during bolus administration of intravenous contrast. CONTRAST:  156mL OMNIPAQUE IOHEXOL 350 MG/ML SOLN COMPARISON:  Chest x-ray from earlier in the same day. FINDINGS: CTA CHEST FINDINGS Cardiovascular: Atherosclerotic calcifications of the thoracic aorta are noted. No aneurysmal dilatation is seen. The heart is mildly enlarged in size. Significant reflux of contrast into the hepatic veins is noted likely related to increased central venous pressure. The pulmonary artery is well visualized within normal branching pattern bilaterally. No filling defect to suggest pulmonary embolism is seen. Coronary calcifications are noted. Mediastinum/Nodes: Thoracic inlet is within normal limits. No sizable hilar or mediastinal adenopathy is noted. The esophagus as visualized is within normal limits. Lungs/Pleura: Mild interstitial thickening is noted likely related to a degree of interstitial edema. No focal infiltrate or sizable effusion is seen. Some areas of mucous plugging are noted particularly in the lower lobes. No sizable parenchymal nodule is seen. Musculoskeletal: Degenerative changes of the thoracic spine are noted. No acute rib abnormality is seen. Review of  the MIP images confirms the above findings. CT ABDOMEN and PELVIS FINDINGS Hepatobiliary: Liver demonstrates findings suggestive of fatty infiltration. Increased enhancement is noted within the left lobe related to previously seen venous reflux. Gallbladder is partially distended. No complicating factors are seen. Pancreas: Unremarkable. No pancreatic ductal dilatation or surrounding inflammatory changes. Spleen: Normal in size without focal abnormality. Adrenals/Urinary Tract: Adrenal glands are within normal limits. Kidneys demonstrate a normal enhancement pattern bilaterally. Right kidney is mildly ptotic. No obstructive changes are seen. Calculi are noted. The bladder is decompressed. Stomach/Bowel: The appendix is well visualized and within normal limits. No obstructive or inflammatory changes of the colon are seen. Small bowel and stomach appear within normal limits. Vascular/Lymphatic: Aortic atherosclerosis. No enlarged abdominal or pelvic lymph nodes. Reproductive: Multiple calcified uterine fibroids are noted. Simple appearing left ovarian cyst measuring 2.6 cm is noted. Other: No abdominal wall hernia or abnormality. No abdominopelvic ascites. Musculoskeletal: Degenerative changes of lumbar spine are seen. Review of the MIP images confirms the above findings. IMPRESSION: CTA of the chest: No evidence of pulmonary emboli. Findings suspicious for increased central venous pressure with hepatic venous reflux. Mild changes of interstitial edema. CT of the abdomen and pelvis: Fatty infiltration of the liver. Multiple calcified uterine fibroids. Simple appearing 2.6 cm left ovarian cyst. No follow-up imaging recommended. Note: This recommendation does not apply to premenarchal patients and to those with increased risk (genetic, family history, elevated tumor markers or other high-risk factors) of ovarian cancer. Reference: JACR 2020 Feb; 17(2):248-254 Aortic Atherosclerosis (ICD10-I70.0). Electronically Signed    By: Inez Catalina M.D.   On: 10/25/2021 18:18   DG Chest Port 1 View  Result Date: 10/25/2021 CLINICAL DATA:  Shortness of breath  EXAM: PORTABLE CHEST 1 VIEW COMPARISON:  None. FINDINGS: Cardiomegaly. Atherosclerotic calcification of the aortic knob. Mild diffuse interstitial prominence. No lobar consolidation. No pleural effusion or pneumothorax. Surgical clips in the right axillary region. IMPRESSION: Cardiomegaly with diffuse interstitial prominence suggesting mild edema. Electronically Signed   By: Davina Poke D.O.   On: 10/25/2021 12:32     Assessment and Plan:   1. Acute CHF Exacerbation (Echo pending for reassessment of EF) - Presents with a 1 month history of worsening dyspnea on exertion, orthopnea and abdominal distention.  BNP elevated to 1118 on admission and CXR consistent with CHF. CTA was negative for a PE. She has been started on IV Lasix 40 mg twice daily with a weight loss of 4 pounds thus far and a recorded output of -1.9 L.  Would continue with IV Lasix at current dosing and aggressive potassium replacement given her K+ at 2.7 this AM. - Repeat echocardiogram is pending to reassess her LV function as this was previously at 55% in 02/2021.  She may require further medication adjustments pending her echo results if her EF has declined.  2. Permanent Atrial Fibrillation/Flutter - It appears she predominantly has atrial fibrillation but has been noted to be in flutter at times. Rates were initially in the 140's but have improved into the 80's to 90's at rest in the 110's this morning. She is currently on IV Cardizem at 10 mg/hr. Will restart beta-blocker therapy with Lopressor 25 mg twice daily and titrate as BP allows so IV Cardizem can be weaned. She was on high-dose Toprol-XL 150mg  in AM/100mg  in PM prior to admission. A rate control strategy was previously pursued given severe LA dilation by prior echo. - This patients CHA2DS2-VASc Score and unadjusted Ischemic Stroke Rate (%  per year) is equal to 9.7 % stroke rate/year from a score of 6 (HTN, DM, Vascular, Female, Age (2)). She has been continued on PTA Eliquis 5mg  BID for anticoagulation.  3. HTN - Her BP was initially elevated at 164/111, SBP improved into the 130's this AM. Remains on IV Cardizem and will start BB therapy as outlined above. Hold PTA Benazepril to allow for titration of AV nodal blocking agents.   4. HLD - She has been continued on PTA Fish Oil and Crestor 5mg  daily.   5. Hypokalemia - K+ at 2.7 this AM. Replacement has already been ordered by the admitting team. Keep K+  ~ 4.0.  6. Elevated LFT's - AST 64 and ALT 71. Likely secondary to hepatic congestion in the setting of volume overload. CT Abdomen did show evidence of fatty infiltration of the liver. Continue to follow with diuresis. Would hold statin if LFT's do not start to improve.    Risk Assessment/Risk Scores:   New York Heart Association (NYHA) Functional Class NYHA Class III  For questions or updates, please contact [facility] Please consult [email or web address] for contact info under    Signed, Erma Heritage, PA-C  10/26/2021 8:10 AM    Attending note:  Patient seen and examined.  I reviewed her records and discussed the case with Diane Woods, I agree with her above findings.  Diane Woods follows with Dr. Candis Musa with [facility] cardiology at [facility].  She has a history of long-term persistent atrial fibrillation/flutter being managed with heart rate control strategy and anticoagulation as of June 2022.  It looks that she had been on amiodarone at one point, presumably without successful maintenance of sinus rhythm.  She is currently admitted  with worsening shortness of breath, also orthopnea and PND over the last month, has been sleeping in a recliner for the last few weeks.  She reports compliance with her medications including high-dose Toprol-XL and Eliquis.  Echocardiogram obtained at [facility] back in March of  this year revealed LVEF approximately 55%, mild to moderate mitral regurgitation, low normal RV contraction with mild to moderate tricuspid regurgitation and normal estimated RVSP.  She had severe biatrial enlargement as well.  On examination she appears comfortable, has been diuresing well after IV Lasix.  She is afebrile.  Currently in atrial fibrillation by telemetry with heart rate between 100-115 bpm.  Lungs exhibit a few crackles noted at the bases.  Cardiac exam with irregularly irregular rhythm and 2/6 systolic murmur at the apex.  No pitting edema.  Note lab work includes potassium 4.0 down to 2.7 with diuresis, BUN 11, creatinine 0.62, AST 65, ALT 76, BNP 1118, normal high-sensitivity troponin I levels, hemoglobin 14.6, platelets 215, INR 2.2, influenza A and COVID-19 negative.  Chest CTA negative for pulmonary embolus.  Hepatic venous reflux noted.  Also mild interstitial changes.  Personally reviewed her ECG from November 17 which shows atrial flutter with variable conduction, borderline low voltage, poor R wave progression.  Patient presents with clinical congestive heart failure, systolic versus diastolic not yet determined, and complicated by persistent atrial fibrillation/flutter with RVR presently.  She is currently on intravenous diltiazem, plan to resume beta-blocker therapy and then consolidate regimen aimed at heart rate control.  CHA2DS2-VASc score is 6 and she remains on Eliquis for stroke prophylaxis.  Replete potassium, follow-up on echocardiogram results.  Unlikely that cardioversion would be pursued given low likelihood of maintaining sinus rhythm in the setting of severe biatrial enlargement.  Satira Sark, M.D., F.A.C.C.

## 2021-10-26 NOTE — Progress Notes (Signed)
   Progress Note  Patient Name: Diane Woods Date of Encounter: 10/26/2021  Primary Cardiologist: Dr. Candis Musa [facility] cardiology  Follow-up echocardiogram today shows LVEF 35 to 40% range with global hypokinesis.  Also mildly reduced RV contraction.  Tachycardia induced cardiomyopathy is certainly possible given her persistent atrial fibrillation/flutter.  It may be that heart rate control has not been as adequate as was suspected at her last cardiology encounter in June.  Plan to transition her back to Toprol-XL starting at 100 mg twice daily, provide oral digoxin load and then go to once daily dose.  Wean off IV diltiazem.  She is improving with diuresis as well.  Goal for weekend is attaining adequate heart rate control and volume status with stable oral regimen.  Cardioversion would not likely be pursued as her chances of staying in sinus rhythm are fairly low, particularly without antiarrhythmic therapy.  Signed, Rozann Lesches, MD  10/26/2021, 11:29 AM

## 2021-10-26 NOTE — Evaluation (Signed)
Occupational Therapy Evaluation Patient Details Name: Diane Woods MRN: [id number] DOB: 1946/01/28 Today's Date: 10/26/2021   History of Present Illness Diane Woods is a 75 y.o. female with medical history significant for atrial fibrillation on Eliquis, type II DM, essential hypertension, mixed hyperlipidemia, acquired hypothyroidism and chronic bronchitis (per patient) who presents to the emergency department due to 3-week onset of worsening shortness of breath, she went to her PCP and she was prescribed with antibiotics (cephalexin and azithromycin) with no improvement of symptoms.  She has not been able to lay flat in bed since the last 2 weeks due to worsening shortness of breath, so she has been sleeping on a recliner.  Patient also complained of recurrent falls due to leg weakness on ambulation within past 2 to 3 weeks, so she returned to her PCP this morning and she was asked to go to the ED for further evaluation and management.  She denies fever, chills, chest pain, nausea, vomiting or abdominal pain.   Clinical Impression   Pt agreeable to OT/PT co-evaluation. Pt reports she if feeling stronger today. Pt able to complete bed mobility without assist. Pt able to transfer to toilet and complete toilet hygiene without assist and without AD. Pt demonstrates WFL UE functional use and A/ROM. Pt appears to be at or near baseline levels and is not recommended for further acute OT services. Pt will be discharged to care of nursing staff for remaining length of stay.      Recommendations for follow up therapy are one component of a multi-disciplinary discharge planning process, led by the attending physician.  Recommendations may be updated based on patient status, additional functional criteria and insurance authorization.   Follow Up Recommendations  No OT follow up    Assistance Recommended at Discharge None  Functional Status Assessment  Patient has not had a recent decline in their  functional status  Equipment Recommendations  None recommended by OT    Recommendations for Other Services       Precautions / Restrictions Precautions Precautions: None Restrictions Weight Bearing Restrictions: No      Mobility Bed Mobility Overal bed mobility: Independent                  Transfers Overall transfer level: Independent                        Balance Overall balance assessment: Mild deficits observed, not formally tested                                         ADL either performed or assessed with clinical judgement   ADL Overall ADL's : Independent                                       General ADL Comments: Pt able to complete toileting and peri-care without assist. Pt able to complete hand washing at the sink without assist.     Vision Baseline Vision/History: 1 Wears glasses Ability to See in Adequate Light: 0 Adequate Patient Visual Report: No change from baseline Vision Assessment?: No apparent visual deficits                Pertinent Vitals/Pain Pain Assessment: 0-10 Pain Score: 4  Pain Location: head Pain Descriptors /  Indicators: Headache Pain Intervention(s): Limited activity within patient's tolerance;Monitored during session     Hand Dominance Right   Extremity/Trunk Assessment Upper Extremity Assessment Upper Extremity Assessment: Overall WFL for tasks assessed   Lower Extremity Assessment Lower Extremity Assessment: Defer to PT evaluation   Cervical / Trunk Assessment Cervical / Trunk Assessment: Normal   Communication Communication Communication: No difficulties   Cognition Arousal/Alertness: Awake/alert Behavior During Therapy: WFL for tasks assessed/performed Overall Cognitive Status: Within Functional Limits for tasks assessed                                                        Home Living Family/patient expects to be discharged to::  Private residence Living Arrangements: Alone Available Help at Discharge: Family;Available PRN/intermittently Type of Home: House Home Access: Stairs to enter [employer] of Steps: 1 Entrance Stairs-Rails: Can reach both;Right;Left Home Layout: One level     Bathroom Shower/Tub: Teacher, early years/pre: Handicapped height Bathroom Accessibility: Yes How Accessible: Accessible via wheelchair;Accessible via walker Home Equipment: Shower seat - built in   Additional Comments: Pt reports she could have someone stay with her if needed.      Prior Functioning/Environment Prior Level of Function : Needs assist       Physical Assist : ADLs (physical)   ADLs (physical): IADLs Mobility Comments: Pt reports leaning on furniture during ambulation within home when feeling weak. ADLs Comments: Pt reports independence with ADL's assisted by fmaily for IADL's.                      OT Goals(Current goals can be found in the care plan section) Acute Rehab OT Goals Patient Stated Goal: rreturn home  OT Frequency:     Barriers to D/C:            Co-evaluation PT/OT/SLP Co-Evaluation/Treatment: Yes Reason for Co-Treatment: To address functional/ADL transfers   OT goals addressed during session: ADL's and self-care                       End of Session    Activity Tolerance: Patient tolerated treatment well Patient left: in bed;with call bell/phone within reach;Other (comment) (Pt with hospital staff for procedure.)  OT Visit Diagnosis: Unsteadiness on feet (R26.81);Other abnormalities of gait and mobility (R26.89);Muscle weakness (generalized) (M62.81)                Time: 1007-1219 OT Time Calculation (min): 21 min Charges:  OT General Charges $OT Visit: 1 Visit OT Evaluation $OT Eval Low Complexity: 1 Low  Belford Pascucci OT, MOT  Larey Seat 10/26/2021, 9:54 AM

## 2021-10-27 LAB — CBC
HCT: 46.9 % — ABNORMAL HIGH (ref 36.0–46.0)
Hemoglobin: 15.5 g/dL — ABNORMAL HIGH (ref 12.0–15.0)
MCH: 32.1 pg (ref 26.0–34.0)
MCHC: 33 g/dL (ref 30.0–36.0)
MCV: 97.1 fL (ref 80.0–100.0)
Platelets: 230 10*3/uL (ref 150–400)
RBC: 4.83 MIL/uL (ref 3.87–5.11)
RDW: 15.8 % — ABNORMAL HIGH (ref 11.5–15.5)
WBC: 9.3 10*3/uL (ref 4.0–10.5)
nRBC: 0 % (ref 0.0–0.2)

## 2021-10-27 LAB — COMPREHENSIVE METABOLIC PANEL
ALT: 66 U/L — ABNORMAL HIGH (ref 0–44)
AST: 52 U/L — ABNORMAL HIGH (ref 15–41)
Albumin: 3.6 g/dL (ref 3.5–5.0)
Alkaline Phosphatase: 126 U/L (ref 38–126)
Anion gap: 9 (ref 5–15)
BUN: 7 mg/dL — ABNORMAL LOW (ref 8–23)
CO2: 28 mmol/L (ref 22–32)
Calcium: 8.7 mg/dL — ABNORMAL LOW (ref 8.9–10.3)
Chloride: 101 mmol/L (ref 98–111)
Creatinine, Ser: 0.58 mg/dL (ref 0.44–1.00)
GFR, Estimated: >60 mL/min (ref >60)
Glucose, Bld: 124 mg/dL — ABNORMAL HIGH (ref 70–99)
Potassium: 3.7 mmol/L (ref 3.5–5.1)
Sodium: 138 mmol/L (ref 135–145)
Total Bilirubin: 2.2 mg/dL — ABNORMAL HIGH (ref 0.3–1.2)
Total Protein: 5.9 g/dL — ABNORMAL LOW (ref 6.5–8.1)

## 2021-10-27 LAB — MAGNESIUM: Magnesium: 1.7 mg/dL (ref 1.7–2.4)

## 2021-10-27 LAB — GLUCOSE, CAPILLARY
Glucose-Capillary: 134 mg/dL — ABNORMAL HIGH (ref 70–99)
Glucose-Capillary: 175 mg/dL — ABNORMAL HIGH (ref 70–99)
Glucose-Capillary: 189 mg/dL — ABNORMAL HIGH (ref 70–99)
Glucose-Capillary: 161 mg/dL — ABNORMAL HIGH (ref 70–99)

## 2021-10-27 MED ORDER — DILTIAZEM HCL 60 MG PO TABS
60.0000 mg | ORAL_TABLET | Freq: Three times a day (TID) | ORAL | Status: DC
  Administered 2021-10-27 (×2): 60 mg via ORAL
  Filled 2021-10-27 (×2): qty 1

## 2021-10-27 MED ORDER — MAGNESIUM SULFATE IN D5W 1-5 GM/100ML-% IV SOLN
1.0000 g | Freq: Once | INTRAVENOUS | Status: AC
  Administered 2021-10-27: 1 g via INTRAVENOUS
  Filled 2021-10-27: qty 100

## 2021-10-27 MED ORDER — POTASSIUM CHLORIDE CRYS ER 20 MEQ PO TBCR
40.0000 meq | EXTENDED_RELEASE_TABLET | Freq: Once | ORAL | Status: AC
  Administered 2021-10-27: 40 meq via ORAL
  Filled 2021-10-27: qty 2

## 2021-10-27 MED ORDER — MAGNESIUM SULFATE 50 % IJ SOLN: 1.0000 g | Freq: Once | INTRAMUSCULAR | Status: DC

## 2021-10-27 MED ORDER — DIGOXIN 125 MCG PO TABS
0.1250 mg | ORAL_TABLET | Freq: Every day | ORAL | Status: DC
  Administered 2021-10-28: 0.125 mg via ORAL
  Filled 2021-10-27: qty 1

## 2021-10-27 MED ORDER — DILTIAZEM HCL 60 MG PO TABS
60.0000 mg | ORAL_TABLET | Freq: Two times a day (BID) | ORAL | Status: DC
  Administered 2021-10-28: 60 mg via ORAL
  Filled 2021-10-27: qty 1

## 2021-10-27 NOTE — Progress Notes (Signed)
PROGRESS NOTE    ZIAIRE BIESER  GYJ:[id number] DOB: Sep 13, 1946 DOA: 10/25/2021 PCP: Neale Burly, MD   Brief Narrative:   MARDELL SUTTLES is a 75 y.o. female with medical history significant for atrial fibrillation on Eliquis, type II DM, essential hypertension, mixed hyperlipidemia, acquired hypothyroidism and chronic bronchitis (per patient) who presents to the emergency department due to 3-week onset of worsening shortness of breath, she went to her PCP and she was prescribed with antibiotics (cephalexin and azithromycin) with no improvement of symptoms.  She was admitted with new onset congestive heart failure and 2D echocardiogram now shows LVEF 35-40% with global hypokinesis.  Patient continues to diurese with IV Lasix per cardiology recommendations.  She was also noted to have atrial fibrillation with RVR and remains on diltiazem drip with plans to wean off with oral medications as recommended per cardiology.  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 -Will try to minimize as much as possible the use of Cardizem given decrease in her ejection fraction. -Monitor on telemetry -Daily weights with strict I's and O's  -Low-sodium diet encouraged.  Permanent atrial fibrillation/flutter with RVR -Cardizem drip almost completely wean off; will use oral Cardizem along with metoprolol  for rate control. -Continue the use of Eliquis.  Hypokalemia/hypomagnesemia -Continue repletion as needed -Goal is for potassium more than 4 and magnesium more than 2.  Mild transaminitis -Likely congestive hepatopathy, continue to monitor. -LFTs stable. -No icterus, no jaundice. -No right upper quadrant discomfort.  Hypertension -Beta-blocker therapy per cardiology  -Continue holding benazepril -Blood pressure will be also controlled with the use of Cardizem now. -Low-sodium diet discussed with patient.  Dyslipidemia -Fish oil and Crestor -Heart healthy diet encouraged.  Hypothyroidism -Levothyroxine  History of chronic bronchitis -Continue Breztri -No wheezing on exam.   DVT prophylaxis: Eliquis Code Status: Full Family Communication: None at bedside Disposition Plan:  Status is: Inpatient  Remains inpatient appropriate because: Requires ongoing IV medications.   Consultants:  Cardiology  Procedures:  See below  Antimicrobials:  None   Subjective: No chest pain, no palpitations and no shortness of breath.  Reports mild intermittent nausea but no vomiting.  Heart rate much better controlled and pending transition of Cardizem drip.  Objective: Vitals:   10/27/21 0700 10/27/21 0800 10/27/21 0852 10/27/21 1141  BP: 130/78 (!) 139/91    Pulse: 87 82 88   Resp: 18 (!) 21  (!) 22  Temp:    97.7 F (36.5 C)  TempSrc:    Oral  SpO2: 92% 96% 98%   Weight:      Height:        Intake/Output Summary (Last 24 hours) at 10/27/2021 1506 Last data filed at 10/27/2021 0835 Gross per 24 hour  Intake 464.48 ml  Output --  Net 464.48 ml   Filed Weights   10/26/21 0012 10/26/21 0500 10/27/21 0435  Weight: 70.8 kg 69 kg 65.8 kg  Examination: General exam: Alert, awake, oriented x 3; reports mild intermittent nausea, pain, no palpitations, no shortness of breath.  Patient is afebrile.  There has not been any episodes of vomiting reported. Respiratory  system: Clear to auscultation. Respiratory effort normal.  No using accessory muscles.  Good saturation on room air. Cardiovascular system: Irregular, no rubs, no gallops, no JVD. Gastrointestinal system: Abdomen is nondistended, soft and nontender. No organomegaly or masses felt. Normal bowel sounds heard. Central nervous system: Alert and oriented. No focal neurological deficits. Extremities: No cyanosis or clubbing. Skin: No petechiae. Psychiatry: Judgement and insight appear normal.  Stable mood.   Data Reviewed: I have personally reviewed following labs and imaging studies  CBC: Recent Labs  Lab 10/25/21 1202 10/26/21 0403 10/27/21 0435  WBC 9.8 8.7 9.3  NEUTROABS 5.1  --   --   HGB 15.5* 14.6 15.5*  HCT 48.2* 45.0 46.9*  MCV 98.0 98.0 97.1  PLT 247 215 161   Basic Metabolic Panel: Recent Labs  Lab 10/25/21 1202 10/26/21 0403 10/27/21 0435  NA 137 139 138  K 4.0 2.7* 3.7  CL 102 102 101  CO2 25 27 28   GLUCOSE 153* 153* 124*  BUN 13 11 7*  CREATININE 0.88 0.62 0.58  CALCIUM 8.6* 8.3* 8.7*  MG  --  1.6* 1.7  PHOS  --  2.6  --    GFR: Estimated Creatinine Clearance: 53.3 mL/min (by C-G formula based on SCr of 0.58 mg/dL).  Liver Function Tests: Recent Labs  Lab 10/25/21 1202 10/26/21 0403 10/27/21 0435  AST 64* 65* 52*  ALT 71* 76* 66*  ALKPHOS 135* 131* 126  BILITOT 1.9* 1.9* 2.2*  PROT 6.0* 5.8* 5.9*  ALBUMIN 3.7 3.6 3.6   Coagulation Profile: Recent Labs  Lab 10/26/21 0403  INR 2.2*   HbA1C: Recent Labs    10/25/21 1202  HGBA1C 6.8*   CBG: Recent Labs  Lab 10/26/21 1236 10/26/21 1611 10/26/21 2127 10/27/21 0753 10/27/21 1143  GLUCAP 186* 210* 135* 134* 175*   Thyroid Function Tests: Recent Labs    10/25/21 1203  TSH 2.342    Recent Results (from the past 240 hour(s))  Resp Panel by RT-PCR (Flu A&B, Covid) Nasopharyngeal Swab     Status: None   Collection Time: 10/25/21 12:13 PM   Specimen: Nasopharyngeal Swab;  Nasopharyngeal(NP) swabs in vial transport medium  Result Value Ref Range Status   SARS Coronavirus 2 by RT PCR NEGATIVE NEGATIVE Final    Comment: (NOTE) SARS-CoV-2 target nucleic acids are NOT DETECTED.  The SARS-CoV-2 RNA is generally detectable in upper respiratory specimens during the acute phase of infection. The lowest concentration of SARS-CoV-2 viral copies this assay can detect is 138 copies/mL. A negative result does not preclude SARS-Cov-2 infection and should not be used as the sole basis for treatment or other patient management decisions. A negative result may occur with  improper specimen collection/handling, submission of specimen other than nasopharyngeal swab, presence of viral mutation(s) within the areas targeted by this assay, and inadequate number of viral copies(<138 copies/mL). A negative result must be combined with clinical observations, patient history, and epidemiological information. The expected result is Negative.  Fact Sheet for Patients:  EntrepreneurPulse.com.au  Fact Sheet for Healthcare Providers:  IncredibleEmployment.be  This test is no t yet approved or cleared by the [place] FDA and  has been authorized for detection and/or diagnosis of SARS-CoV-2 by FDA under an Emergency Use Authorization (EUA). This EUA will remain  in  effect (meaning this test can be used) for the duration of the COVID-19 declaration under Section 564(b)(1) of the Act, 21 U.S.C.section 360bbb-3(b)(1), unless the authorization is terminated  or revoked sooner.       Influenza A by PCR NEGATIVE NEGATIVE Final   Influenza B by PCR NEGATIVE NEGATIVE Final    Comment: (NOTE) The Xpert Xpress SARS-CoV-2/FLU/RSV plus assay is intended as an aid in the diagnosis of influenza from Nasopharyngeal swab specimens and should not be used as a sole basis for treatment. Nasal washings and aspirates are unacceptable for Xpert Xpress  SARS-CoV-2/FLU/RSV testing.  Fact Sheet for Patients: EntrepreneurPulse.com.au  Fact Sheet for Healthcare Providers: IncredibleEmployment.be  This test is not yet approved or cleared by the [place] FDA and has been authorized for detection and/or diagnosis of SARS-CoV-2 by FDA under an Emergency Use Authorization (EUA). This EUA will remain in effect (meaning this test can be used) for the duration of the COVID-19 declaration under Section 564(b)(1) of the Act, 21 U.S.C. section 360bbb-3(b)(1), unless the authorization is terminated or revoked.  Performed at [facility], [address] [place], [place] [address]   MRSA Next Gen by PCR, Nasal     Status: None   Collection Time: 10/26/21 12:04 AM   Specimen: Nasal Mucosa; Nasal Swab  Result Value Ref Range Status   MRSA by PCR Next Gen NOT DETECTED NOT DETECTED Final    Comment: (NOTE) The GeneXpert MRSA Assay (FDA approved for NASAL specimens only), is one component of a comprehensive MRSA colonization surveillance program. It is not intended to diagnose MRSA infection nor to guide or monitor treatment for MRSA infections. Test performance is not FDA approved in patients less than 21 years old. Performed at [facility], [address], [place],  [address]      Radiology Studies: CT Angio Chest PE W/Cm &/Or Wo Cm  Result Date: 10/25/2021 CLINICAL DATA:  Shortness of breath for 1 month with abdominal pain and abnormal LFTs. EXAM: CT ANGIOGRAPHY CHEST CT ABDOMEN AND PELVIS WITH CONTRAST TECHNIQUE: Multidetector CT imaging of the chest was performed using the standard protocol during bolus administration of intravenous contrast. Multiplanar CT image reconstructions and MIPs were obtained to evaluate the vascular anatomy. Multidetector CT imaging of the abdomen and pelvis was performed using the standard protocol during bolus administration of intravenous contrast. CONTRAST:  140mL  OMNIPAQUE IOHEXOL 350 MG/ML SOLN COMPARISON:  Chest x-ray from earlier in the same day. FINDINGS: CTA CHEST FINDINGS Cardiovascular: Atherosclerotic calcifications of the thoracic aorta are noted. No aneurysmal dilatation is seen. The heart is mildly enlarged in size. Significant reflux of contrast into the hepatic veins is noted likely related to increased central venous pressure. The pulmonary artery is well visualized within normal branching pattern bilaterally. No filling defect to suggest pulmonary embolism is seen. Coronary calcifications are noted. Mediastinum/Nodes: Thoracic inlet is within normal limits. No sizable hilar or mediastinal adenopathy is noted. The esophagus as visualized is within normal limits. Lungs/Pleura: Mild interstitial thickening is noted likely related to a degree of interstitial edema. No focal infiltrate or sizable effusion is seen. Some areas of mucous plugging are noted particularly in the lower lobes. No sizable parenchymal nodule is seen. Musculoskeletal: Degenerative changes of the thoracic spine are noted. No acute rib abnormality is seen. Review of the MIP images confirms the above findings. CT ABDOMEN and PELVIS FINDINGS Hepatobiliary: Liver demonstrates findings suggestive of fatty infiltration. Increased enhancement is noted within the left lobe related to previously seen  venous reflux. Gallbladder is partially distended. No complicating factors are seen. Pancreas: Unremarkable. No pancreatic ductal dilatation or surrounding inflammatory changes. Spleen: Normal in size without focal abnormality. Adrenals/Urinary Tract: Adrenal glands are within normal limits. Kidneys demonstrate a normal enhancement pattern bilaterally. Right kidney is mildly ptotic. No obstructive changes are seen. Calculi are noted. The bladder is decompressed. Stomach/Bowel: The appendix is well visualized and within normal limits. No obstructive or inflammatory changes of the colon are seen. Small bowel  and stomach appear within normal limits. Vascular/Lymphatic: Aortic atherosclerosis. No enlarged abdominal or pelvic lymph nodes. Reproductive: Multiple calcified uterine fibroids are noted. Simple appearing left ovarian cyst measuring 2.6 cm is noted. Other: No abdominal wall hernia or abnormality. No abdominopelvic ascites. Musculoskeletal: Degenerative changes of lumbar spine are seen. Review of the MIP images confirms the above findings. IMPRESSION: CTA of the chest: No evidence of pulmonary emboli. Findings suspicious for increased central venous pressure with hepatic venous reflux. Mild changes of interstitial edema. CT of the abdomen and pelvis: Fatty infiltration of the liver. Multiple calcified uterine fibroids. Simple appearing 2.6 cm left ovarian cyst. No follow-up imaging recommended. Note: This recommendation does not apply to premenarchal patients and to those with increased risk (genetic, family history, elevated tumor markers or other high-risk factors) of ovarian cancer. Reference: JACR 2020 Feb; 17(2):248-254 Aortic Atherosclerosis (ICD10-I70.0). Electronically Signed   By: Inez Catalina M.D.   On: 10/25/2021 18:18   CT Abdomen Pelvis W Contrast  Result Date: 10/25/2021 CLINICAL DATA:  Shortness of breath for 1 month with abdominal pain and abnormal LFTs. EXAM: CT ANGIOGRAPHY CHEST CT ABDOMEN AND PELVIS WITH CONTRAST TECHNIQUE: Multidetector CT imaging of the chest was performed using the standard protocol during bolus administration of intravenous contrast. Multiplanar CT image reconstructions and MIPs were obtained to evaluate the vascular anatomy. Multidetector CT imaging of the abdomen and pelvis was performed using the standard protocol during bolus administration of intravenous contrast. CONTRAST:  123mL OMNIPAQUE IOHEXOL 350 MG/ML SOLN COMPARISON:  Chest x-ray from earlier in the same day. FINDINGS: CTA CHEST FINDINGS Cardiovascular: Atherosclerotic calcifications of the thoracic aorta  are noted. No aneurysmal dilatation is seen. The heart is mildly enlarged in size. Significant reflux of contrast into the hepatic veins is noted likely related to increased central venous pressure. The pulmonary artery is well visualized within normal branching pattern bilaterally. No filling defect to suggest pulmonary embolism is seen. Coronary calcifications are noted. Mediastinum/Nodes: Thoracic inlet is within normal limits. No sizable hilar or mediastinal adenopathy is noted. The esophagus as visualized is within normal limits. Lungs/Pleura: Mild interstitial thickening is noted likely related to a degree of interstitial edema. No focal infiltrate or sizable effusion is seen. Some areas of mucous plugging are noted particularly in the lower lobes. No sizable parenchymal nodule is seen. Musculoskeletal: Degenerative changes of the thoracic spine are noted. No acute rib abnormality is seen. Review of the MIP images confirms the above findings. CT ABDOMEN and PELVIS FINDINGS Hepatobiliary: Liver demonstrates findings suggestive of fatty infiltration. Increased enhancement is noted within the left lobe related to previously seen venous reflux. Gallbladder is partially distended. No complicating factors are seen. Pancreas: Unremarkable. No pancreatic ductal dilatation or surrounding inflammatory changes. Spleen: Normal in size without focal abnormality. Adrenals/Urinary Tract: Adrenal glands are within normal limits. Kidneys demonstrate a normal enhancement pattern bilaterally. Right kidney is mildly ptotic. No obstructive changes are seen. Calculi are noted. The bladder is decompressed. Stomach/Bowel: The appendix is well visualized and within normal limits.  No obstructive or inflammatory changes of the colon are seen. Small bowel and stomach appear within normal limits. Vascular/Lymphatic: Aortic atherosclerosis. No enlarged abdominal or pelvic lymph nodes. Reproductive: Multiple calcified uterine fibroids are  noted. Simple appearing left ovarian cyst measuring 2.6 cm is noted. Other: No abdominal wall hernia or abnormality. No abdominopelvic ascites. Musculoskeletal: Degenerative changes of lumbar spine are seen. Review of the MIP images confirms the above findings. IMPRESSION: CTA of the chest: No evidence of pulmonary emboli. Findings suspicious for increased central venous pressure with hepatic venous reflux. Mild changes of interstitial edema. CT of the abdomen and pelvis: Fatty infiltration of the liver. Multiple calcified uterine fibroids. Simple appearing 2.6 cm left ovarian cyst. No follow-up imaging recommended. Note: This recommendation does not apply to premenarchal patients and to those with increased risk (genetic, family history, elevated tumor markers or other high-risk factors) of ovarian cancer. Reference: JACR 2020 Feb; 17(2):248-254 Aortic Atherosclerosis (ICD10-I70.0). Electronically Signed   By: Inez Catalina M.D.   On: 10/25/2021 18:18   ECHOCARDIOGRAM COMPLETE  Result Date: 10/26/2021    ECHOCARDIOGRAM REPORT   Patient Name:   LOURA PITT Dalsanto Date of Exam: 10/26/2021 Medical Rec #:  [id number]      Height:       62.0 in Accession #:    [id number]     Weight:       152.1 lb Date of Birth:  Feb 17, 1946      BSA:          1.702 m Patient Age:    34 years       BP:           159/110 mmHg Patient Gender: F              HR:           96 bpm. Exam Location:  [facility] Procedure: 3D Echo, Cardiac Doppler and Color Doppler Indications:    CHF-Acute Diastolic  History:        Patient has prior history of Echocardiogram examinations, most                 recent 04/29/2011. CHF, Arrythmias:Atrial Fibrillation and                 Tachycardia; Risk Factors:Hypertension, Diabetes and                 Dyslipidemia.  Sonographer:    Wenda Low Referring Phys: [id number] OLADAPO ADEFESO IMPRESSIONS  1. Left ventricular ejection fraction, by estimation, is 35 to 40%. The left ventricle has moderately decreased  function. The left ventricle demonstrates global hypokinesis. There is mild left ventricular hypertrophy. Left ventricular diastolic parameters are indeterminate.  2. Right ventricular systolic function is mildly reduced. The right ventricular size is normal. There is mildly elevated pulmonary artery systolic pressure. The estimated right ventricular systolic pressure is 32.6 mmHg.  3. Left atrial size was moderately dilated.  4. Right atrial size was moderately dilated.  5. The mitral valve is grossly normal. Mild mitral valve regurgitation.  6. Tricuspid valve regurgitation is moderate.  7. The aortic valve is tricuspid. Aortic valve regurgitation is mild. Aortic valve mean gradient measures 2.3 mmHg.  8. The inferior vena cava is dilated in size with <50% respiratory variability, suggesting right atrial pressure of 15 mmHg. Comparison(s): Prior images unable to be directly viewed, comparison made by report only. LVEF has decreased in comparison to outside echocardiogram report from March 2022. FINDINGS  Left Ventricle: Left  ventricular ejection fraction, by estimation, is 35 to 40%. The left ventricle has moderately decreased function. The left ventricle demonstrates global hypokinesis. The left ventricular internal cavity size was normal in size. There is mild left ventricular hypertrophy. Left ventricular diastolic parameters are indeterminate. Right Ventricle: The right ventricular size is normal. No increase in right ventricular wall thickness. Right ventricular systolic function is mildly reduced. There is mildly elevated pulmonary artery systolic pressure. The tricuspid regurgitant velocity  is 2.60 m/s, and with an assumed right atrial pressure of 15 mmHg, the estimated right ventricular systolic pressure is 58.3 mmHg. Left Atrium: Left atrial size was moderately dilated. Right Atrium: Right atrial size was moderately dilated. Pericardium: There is no evidence of pericardial effusion. Mitral Valve: The  mitral valve is grossly normal. Mild mitral annular calcification. Mild mitral valve regurgitation. MV peak gradient, 6.2 mmHg. The mean mitral valve gradient is 2.0 mmHg. Tricuspid Valve: The tricuspid valve is grossly normal. Tricuspid valve regurgitation is moderate. Aortic Valve: The aortic valve is tricuspid. There is mild aortic valve annular calcification. Aortic valve regurgitation is mild. Aortic valve mean gradient measures 2.3 mmHg. Aortic valve peak gradient measures 5.4 mmHg. Aortic valve area, by VTI measures 2.04 cm. Pulmonic Valve: The pulmonic valve was grossly normal. Pulmonic valve regurgitation is trivial. Aorta: The aortic root is normal in size and structure. Venous: The inferior vena cava is dilated in size with less than 50% respiratory variability, suggesting right atrial pressure of 15 mmHg. IAS/Shunts: No atrial level shunt detected by color flow Doppler.  LEFT VENTRICLE PLAX 2D LVIDd:         4.90 cm   Diastology LVIDs:         3.95 cm   LV e' medial:    8.92 cm/s LV PW:         1.00 cm   LV E/e' medial:  13.0 LV IVS:        1.10 cm   LV e' lateral:   11.90 cm/s LVOT diam:     2.10 cm   LV E/e' lateral: 9.7 LV SV:         37 LV SV Index:   22 LVOT Area:     3.46 cm  RIGHT VENTRICLE RV Basal diam:  4.30 cm RV Mid diam:    3.60 cm RV S prime:     13.80 cm/s TAPSE (M-mode): 1.7 cm LEFT ATRIUM             Index        RIGHT ATRIUM           Index LA diam:        4.90 cm 2.88 cm/m   RA Area:     22.50 cm LA Vol (A2C):   55.3 ml 32.50 ml/m  RA Volume:   61.60 ml  36.20 ml/m LA Vol (A4C):   68.2 ml 40.08 ml/m LA Biplane Vol: 64.6 ml 37.96 ml/m  AORTIC VALVE                    PULMONIC VALVE AV Area (Vmax):    1.96 cm     PV Vmax:       0.54 m/s AV Area (Vmean):   1.91 cm     PV Peak grad:  1.1 mmHg AV Area (VTI):     2.04 cm AV Vmax:           116.67 cm/s AV Vmean:  75.067 cm/s AV VTI:            0.180 m AV Peak Grad:      5.4 mmHg AV Mean Grad:      2.3 mmHg LVOT Vmax:          66.10 cm/s LVOT Vmean:        41.500 cm/s LVOT VTI:          0.106 m LVOT/AV VTI ratio: 0.59  AORTA Ao Root diam: 2.60 cm Ao Asc diam:  3.10 cm MITRAL VALVE                TRICUSPID VALVE MV Area (PHT): 5.50 cm     TR Peak grad:   27.0 mmHg MV Area VTI:   1.44 cm     TR Vmax:        260.00 cm/s MV Peak grad:  6.2 mmHg MV Mean grad:  2.0 mmHg     SHUNTS MV Vmax:       1.24 m/s     Systemic VTI:  0.11 m MV Vmean:      63.3 cm/s    Systemic Diam: 2.10 cm MV Decel Time: 138 msec MV E velocity: 116.00 cm/s Rozann Lesches MD Electronically signed by Rozann Lesches MD Signature Date/Time: 10/26/2021/10:44:03 AM    Final     Scheduled Meds:  apixaban  5 mg Oral BID   Chlorhexidine Gluconate Cloth  6 each Topical Daily   diltiazem  60 mg Oral Q8H   furosemide  40 mg Intravenous BID   insulin aspart  0-5 Units Subcutaneous QHS   insulin aspart  0-9 Units Subcutaneous TID WC   levothyroxine  100 mcg Oral Q0600   metoprolol succinate  100 mg Oral BID   mometasone-formoterol  2 puff Inhalation BID   omega-3 acid ethyl esters  4,000 mg Oral Daily   rosuvastatin  5 mg Oral Daily   umeclidinium bromide  1 puff Inhalation Daily   Continuous Infusions:    LOS: 2 days    Time spent: 35 minutes    Barton Dubois, MD [facility]  If 7PM-7AM, please contact night-coverage [email or web address] 10/27/2021, 3:06 PM

## 2021-10-28 DIAGNOSIS — I5021 Acute systolic (congestive) heart failure: Secondary | ICD-10-CM

## 2021-10-28 LAB — GLUCOSE, CAPILLARY
Glucose-Capillary: 156 mg/dL — ABNORMAL HIGH (ref 70–99)
Glucose-Capillary: 223 mg/dL — ABNORMAL HIGH (ref 70–99)

## 2021-10-28 MED ORDER — METOPROLOL SUCCINATE ER 100 MG PO TB24: 100.0000 mg | ORAL_TABLET | Freq: Two times a day (BID) | ORAL | 2 refills | Status: AC

## 2021-10-28 MED ORDER — DILTIAZEM HCL 60 MG PO TABS: 60.0000 mg | ORAL_TABLET | Freq: Two times a day (BID) | ORAL | 2 refills | Status: AC

## 2021-10-28 MED ORDER — DIGOXIN 125 MCG PO TABS: 0.1250 mg | ORAL_TABLET | Freq: Every day | ORAL | 2 refills | Status: AC

## 2021-10-28 MED ORDER — FUROSEMIDE 20 MG PO TABS: 20.0000 mg | ORAL_TABLET | Freq: Every day | ORAL | 11 refills | Status: AC | PRN

## 2021-10-28 MED ORDER — BENAZEPRIL HCL 10 MG PO TABS
20.0000 mg | ORAL_TABLET | Freq: Two times a day (BID) | ORAL | Status: DC
  Administered 2021-10-28: 20 mg via ORAL
  Filled 2021-10-28: qty 2

## 2021-10-28 NOTE — TOC Transition Note (Signed)
Transition of Care [facility]) - CM/SW Discharge Note   Patient Details  Name: Diane Woods MRN: [id number] Date of Birth: 02/08/1946  Transition of Care [facility]) CM/SW Contact:  Natasha Bence, LCSW Phone Number: 10/28/2021, 2:06 PM   Clinical Narrative:    CSW notified of patient's readiness for discharge. Eric with [facility] agreaable to resume [facility] services and requested orders and d/c summary be sent to his [place] email. CSW emailed summary and orders. TOC signing off.   Final next level of care: [place] Barriers to Discharge: Barriers Resolved   Patient Goals and CMS Choice Patient states their goals for this hospitalization and ongoing recovery are:: Return home with [facility] CMS Medicare.gov Compare Post Acute Care list provided to:: Patient Choice offered to / list presented to : Patient  Discharge Placement                    Patient and family notified of of transfer: 10/28/21  Discharge Plan and Services In-house Referral: Clinical Social Work   Post Acute Care Choice: Home Health, Resumption of Svcs/PTA Provider                    HH Arranged: PT [place] Agency: Other - See comment ([place]) Date [place]: 10/28/21 Time [place]: 1403 Representative spoke with at [place]: [place] ([place]) Interventions     Readmission Risk Interventions Readmission Risk Prevention Plan 10/26/2021  Medication Screening Complete  Transportation Screening Complete  Some recent data might be hidden

## 2021-10-28 NOTE — Discharge Summary (Signed)
Physician Discharge Summary  Diane Woods [facility]:[id number] DOB: 14-Nov-1946 DOA: 10/25/2021  PCP: Neale Burly, MD  Admit date: 10/25/2021  Discharge date: 10/28/2021  Admitted From:Home  Disposition:  Home  Recommendations for Outpatient Follow-up:  Follow up with PCP in 1-2 weeks Follow-up with cardiologist Dr. Candis Musa at [facility] appointment Continue on metoprolol and digoxin as prescribed Continue on Cardizem as prescribed Continue on Lasix as needed and may need scheduled dosing in the near future after follow-up with her cardiologist Continue other home medications as prior  Home Health:Yes with RN/PT  Equipment/Devices:None  Discharge Condition:Stable  CODE STATUS: Full  Diet recommendation: Heart Healthy  Brief/Interim Summary: Diane Woods is a 75 y.o. female with medical history significant for atrial fibrillation on Eliquis, type II DM, essential hypertension, mixed hyperlipidemia, acquired hypothyroidism and chronic bronchitis (per patient) who presents to the emergency department due to 3-week onset of worsening shortness of breath, she went to her PCP and she was prescribed with antibiotics (cephalexin and azithromycin) with no improvement of symptoms.  She was admitted with new onset congestive heart failure and 2D echocardiogram now shows LVEF 35-40% with global hypokinesis.  She was diuresed with IV Lasix per cardiology recommendations and is now euvolemic.  She was noted to have atrial fibrillation with RVR and was initially on Cardizem drip which was weaned off and is now tolerating oral Cardizem as well as digoxin and metoprolol XL.  She now has stable heart rates and no further symptomatology as well as stable blood pressure readings and is ready for discharge.  She will follow-up with her cardiologist at [facility] and continue to remain on medications as prescribed with no other acute events noted.  Discharge Diagnoses:  Principal Problem:   CHF (congestive  heart failure) (HCC) Active Problems:   Atrial fibrillation with RVR (HCC)   Essential hypertension, benign   Mixed hyperlipidemia   Hyperglycemia due to diabetes mellitus (HCC)   Acquired hypothyroidism   Falls   Transaminitis   Elevated brain natriuretic peptide (BNP) level   History of chronic bronchitis   Prolonged QT interval  Principal discharge diagnosis: Acute systolic CHF exacerbation in the setting of permanent atrial fibrillation/flutter with RVR.  Discharge Instructions  Discharge Instructions     Diet - low sodium heart healthy   Complete by: As directed    Increase activity slowly   Complete by: As directed       Allergies as of 10/28/2021       Reactions   Penicillins Swelling   THROAT SWELLING   Sulfa Antibiotics Rash        Medication List     STOP taking these medications    atenolol 50 MG tablet Commonly known as: TENORMIN   diclofenac 75 MG EC tablet Commonly known as: VOLTAREN   fenofibrate 145 MG tablet Commonly known as: TRICOR   flecainide 50 MG tablet Commonly known as: TAMBOCOR       TAKE these medications    ALPRAZolam 0.5 MG tablet Commonly known as: XANAX Take 0.5 mg by mouth at bedtime as needed.   azelastine 0.1 % nasal spray Commonly known as: ASTELIN 1 spray by Nasal route 2 (two) times daily. Use in each nostril as directed   benazepril 20 MG tablet Commonly known as: LOTENSIN Take 20 mg by mouth 2 (two) times daily.   Breztri Aerosphere 160-9-4.8 MCG/ACT Aero Generic drug: Budeson-Glycopyrrol-Formoterol Inhale 2 puffs into the lungs 2 (two) times daily.   digoxin 0.125 MG tablet  Commonly known as: LANOXIN Take 1 tablet (0.125 mg total) by mouth daily. Start taking on: October 29, 2021   diltiazem 60 MG tablet Commonly known as: CARDIZEM Take 1 tablet (60 mg total) by mouth every 12 (twelve) hours.   Eliquis 5 MG Tabs tablet Generic drug: apixaban Take 5 mg by mouth 2 (two) times daily.   fish  oil-omega-3 fatty acids 1000 MG capsule Take 1,200 mg by mouth daily. Take 4 tablets by mouth daily.   FLUoxetine 40 MG capsule Commonly known as: PROZAC Take 40 mg by mouth daily.   furosemide 20 MG tablet Commonly known as: Lasix Take 1 tablet (20 mg total) by mouth daily as needed for fluid or edema.   glimepiride 2 MG tablet Commonly known as: AMARYL Take 2 mg by mouth daily.   levothyroxine 100 MCG tablet Commonly known as: SYNTHROID Take 100 mcg by mouth every morning.   loratadine 10 MG tablet Commonly known as: CLARITIN Take 10 mg by mouth daily.   metoprolol succinate 100 MG 24 hr tablet Commonly known as: TOPROL-XL Take 1 tablet (100 mg total) by mouth 2 (two) times daily. Take with or immediately following a meal. What changed:  how much to take additional instructions   oxyCODONE-acetaminophen 10-325 MG tablet Commonly known as: PERCOCET Take 1 tablet by mouth every 4 (four) hours as needed.   rosuvastatin 10 MG tablet Commonly known as: CRESTOR Take 5 mg by mouth daily.   zolpidem 10 MG tablet Commonly known as: AMBIEN Take 10 mg by mouth at bedtime as needed.        Follow-up Information     Neale Burly, MD. Schedule an appointment as soon as possible for a visit in 1 week(s).   Specialty: Internal Medicine Contact information: [place] [address] 6[address] [phone]         Assar, Soheil, DO. Schedule an appointment as soon as possible for a visit.   Specialty: Cardiology Contact information: [address] [place] [address] [phone]                Allergies  Allergen Reactions   Penicillins Swelling    THROAT SWELLING   Sulfa Antibiotics Rash    Consultations: Cardiology   Procedures/Studies: CT Angio Chest PE W/Cm &/Or Wo Cm  Result Date: 10/25/2021 CLINICAL DATA:  Shortness of breath for 1 month with abdominal pain and abnormal LFTs. EXAM: CT ANGIOGRAPHY CHEST CT ABDOMEN AND  PELVIS WITH CONTRAST TECHNIQUE: Multidetector CT imaging of the chest was performed using the standard protocol during bolus administration of intravenous contrast. Multiplanar CT image reconstructions and MIPs were obtained to evaluate the vascular anatomy. Multidetector CT imaging of the abdomen and pelvis was performed using the standard protocol during bolus administration of intravenous contrast. CONTRAST:  174mL OMNIPAQUE IOHEXOL 350 MG/ML SOLN COMPARISON:  Chest x-ray from earlier in the same day. FINDINGS: CTA CHEST FINDINGS Cardiovascular: Atherosclerotic calcifications of the thoracic aorta are noted. No aneurysmal dilatation is seen. The heart is mildly enlarged in size. Significant reflux of contrast into the hepatic veins is noted likely related to increased central venous pressure. The pulmonary artery is well visualized within normal branching pattern bilaterally. No filling defect to suggest pulmonary embolism is seen. Coronary calcifications are noted. Mediastinum/Nodes: Thoracic inlet is within normal limits. No sizable hilar or mediastinal adenopathy is noted. The esophagus as visualized is within normal limits. Lungs/Pleura: Mild interstitial thickening is noted likely related to  a degree of interstitial edema. No focal infiltrate or sizable effusion is seen. Some areas of mucous plugging are noted particularly in the lower lobes. No sizable parenchymal nodule is seen. Musculoskeletal: Degenerative changes of the thoracic spine are noted. No acute rib abnormality is seen. Review of the MIP images confirms the above findings. CT ABDOMEN and PELVIS FINDINGS Hepatobiliary: Liver demonstrates findings suggestive of fatty infiltration. Increased enhancement is noted within the left lobe related to previously seen venous reflux. Gallbladder is partially distended. No complicating factors are seen. Pancreas: Unremarkable. No pancreatic ductal dilatation or surrounding inflammatory changes. Spleen:  Normal in size without focal abnormality. Adrenals/Urinary Tract: Adrenal glands are within normal limits. Kidneys demonstrate a normal enhancement pattern bilaterally. Right kidney is mildly ptotic. No obstructive changes are seen. Calculi are noted. The bladder is decompressed. Stomach/Bowel: The appendix is well visualized and within normal limits. No obstructive or inflammatory changes of the colon are seen. Small bowel and stomach appear within normal limits. Vascular/Lymphatic: Aortic atherosclerosis. No enlarged abdominal or pelvic lymph nodes. Reproductive: Multiple calcified uterine fibroids are noted. Simple appearing left ovarian cyst measuring 2.6 cm is noted. Other: No abdominal wall hernia or abnormality. No abdominopelvic ascites. Musculoskeletal: Degenerative changes of lumbar spine are seen. Review of the MIP images confirms the above findings. IMPRESSION: CTA of the chest: No evidence of pulmonary emboli. Findings suspicious for increased central venous pressure with hepatic venous reflux. Mild changes of interstitial edema. CT of the abdomen and pelvis: Fatty infiltration of the liver. Multiple calcified uterine fibroids. Simple appearing 2.6 cm left ovarian cyst. No follow-up imaging recommended. Note: This recommendation does not apply to premenarchal patients and to those with increased risk (genetic, family history, elevated tumor markers or other high-risk factors) of ovarian cancer. Reference: JACR 2020 Feb; 17(2):248-254 Aortic Atherosclerosis (ICD10-I70.0). Electronically Signed   By: Inez Catalina M.D.   On: 10/25/2021 18:18   CT Abdomen Pelvis W Contrast  Result Date: 10/25/2021 CLINICAL DATA:  Shortness of breath for 1 month with abdominal pain and abnormal LFTs. EXAM: CT ANGIOGRAPHY CHEST CT ABDOMEN AND PELVIS WITH CONTRAST TECHNIQUE: Multidetector CT imaging of the chest was performed using the standard protocol during bolus administration of intravenous contrast. Multiplanar CT  image reconstructions and MIPs were obtained to evaluate the vascular anatomy. Multidetector CT imaging of the abdomen and pelvis was performed using the standard protocol during bolus administration of intravenous contrast. CONTRAST:  119mL OMNIPAQUE IOHEXOL 350 MG/ML SOLN COMPARISON:  Chest x-ray from earlier in the same day. FINDINGS: CTA CHEST FINDINGS Cardiovascular: Atherosclerotic calcifications of the thoracic aorta are noted. No aneurysmal dilatation is seen. The heart is mildly enlarged in size. Significant reflux of contrast into the hepatic veins is noted likely related to increased central venous pressure. The pulmonary artery is well visualized within normal branching pattern bilaterally. No filling defect to suggest pulmonary embolism is seen. Coronary calcifications are noted. Mediastinum/Nodes: Thoracic inlet is within normal limits. No sizable hilar or mediastinal adenopathy is noted. The esophagus as visualized is within normal limits. Lungs/Pleura: Mild interstitial thickening is noted likely related to a degree of interstitial edema. No focal infiltrate or sizable effusion is seen. Some areas of mucous plugging are noted particularly in the lower lobes. No sizable parenchymal nodule is seen. Musculoskeletal: Degenerative changes of the thoracic spine are noted. No acute rib abnormality is seen. Review of the MIP images confirms the above findings. CT ABDOMEN and PELVIS FINDINGS Hepatobiliary: Liver demonstrates findings suggestive of fatty infiltration. Increased enhancement  is noted within the left lobe related to previously seen venous reflux. Gallbladder is partially distended. No complicating factors are seen. Pancreas: Unremarkable. No pancreatic ductal dilatation or surrounding inflammatory changes. Spleen: Normal in size without focal abnormality. Adrenals/Urinary Tract: Adrenal glands are within normal limits. Kidneys demonstrate a normal enhancement pattern bilaterally. Right kidney is  mildly ptotic. No obstructive changes are seen. Calculi are noted. The bladder is decompressed. Stomach/Bowel: The appendix is well visualized and within normal limits. No obstructive or inflammatory changes of the colon are seen. Small bowel and stomach appear within normal limits. Vascular/Lymphatic: Aortic atherosclerosis. No enlarged abdominal or pelvic lymph nodes. Reproductive: Multiple calcified uterine fibroids are noted. Simple appearing left ovarian cyst measuring 2.6 cm is noted. Other: No abdominal wall hernia or abnormality. No abdominopelvic ascites. Musculoskeletal: Degenerative changes of lumbar spine are seen. Review of the MIP images confirms the above findings. IMPRESSION: CTA of the chest: No evidence of pulmonary emboli. Findings suspicious for increased central venous pressure with hepatic venous reflux. Mild changes of interstitial edema. CT of the abdomen and pelvis: Fatty infiltration of the liver. Multiple calcified uterine fibroids. Simple appearing 2.6 cm left ovarian cyst. No follow-up imaging recommended. Note: This recommendation does not apply to premenarchal patients and to those with increased risk (genetic, family history, elevated tumor markers or other high-risk factors) of ovarian cancer. Reference: JACR 2020 Feb; 17(2):248-254 Aortic Atherosclerosis (ICD10-I70.0). Electronically Signed   By: Inez Catalina M.D.   On: 10/25/2021 18:18   DG Chest Port 1 View  Result Date: 10/25/2021 CLINICAL DATA:  Shortness of breath EXAM: PORTABLE CHEST 1 VIEW COMPARISON:  None. FINDINGS: Cardiomegaly. Atherosclerotic calcification of the aortic knob. Mild diffuse interstitial prominence. No lobar consolidation. No pleural effusion or pneumothorax. Surgical clips in the right axillary region. IMPRESSION: Cardiomegaly with diffuse interstitial prominence suggesting mild edema. Electronically Signed   By: Davina Poke D.O.   On: 10/25/2021 12:32   ECHOCARDIOGRAM COMPLETE  Result Date:  10/26/2021    ECHOCARDIOGRAM REPORT   Patient Name:   Diane Woods Dayley Date of Exam: 10/26/2021 Medical Rec #:  [id number]      Height:       62.0 in Accession #:    [id number]     Weight:       152.1 lb Date of Birth:  09/02/1946      BSA:          1.702 m Patient Age:    75 years       BP:           159/110 mmHg Patient Gender: F              HR:           96 bpm. Exam Location:  [facility] Procedure: 3D Echo, Cardiac Doppler and Color Doppler Indications:    CHF-Acute Diastolic  History:        Patient has prior history of Echocardiogram examinations, most                 recent 04/29/2011. CHF, Arrythmias:Atrial Fibrillation and                 Tachycardia; Risk Factors:Hypertension, Diabetes and                 Dyslipidemia.  Sonographer:    Wenda Low Referring Phys: [id number] OLADAPO ADEFESO IMPRESSIONS  1. Left ventricular ejection fraction, by estimation, is 35 to 40%. The left ventricle has moderately decreased function.  The left ventricle demonstrates global hypokinesis. There is mild left ventricular hypertrophy. Left ventricular diastolic parameters are indeterminate.  2. Right ventricular systolic function is mildly reduced. The right ventricular size is normal. There is mildly elevated pulmonary artery systolic pressure. The estimated right ventricular systolic pressure is 95.1 mmHg.  3. Left atrial size was moderately dilated.  4. Right atrial size was moderately dilated.  5. The mitral valve is grossly normal. Mild mitral valve regurgitation.  6. Tricuspid valve regurgitation is moderate.  7. The aortic valve is tricuspid. Aortic valve regurgitation is mild. Aortic valve mean gradient measures 2.3 mmHg.  8. The inferior vena cava is dilated in size with <50% respiratory variability, suggesting right atrial pressure of 15 mmHg. Comparison(s): Prior images unable to be directly viewed, comparison made by report only. LVEF has decreased in comparison to outside echocardiogram report from March 2022.  FINDINGS  Left Ventricle: Left ventricular ejection fraction, by estimation, is 35 to 40%. The left ventricle has moderately decreased function. The left ventricle demonstrates global hypokinesis. The left ventricular internal cavity size was normal in size. There is mild left ventricular hypertrophy. Left ventricular diastolic parameters are indeterminate. Right Ventricle: The right ventricular size is normal. No increase in right ventricular wall thickness. Right ventricular systolic function is mildly reduced. There is mildly elevated pulmonary artery systolic pressure. The tricuspid regurgitant velocity  is 2.60 m/s, and with an assumed right atrial pressure of 15 mmHg, the estimated right ventricular systolic pressure is 88.4 mmHg. Left Atrium: Left atrial size was moderately dilated. Right Atrium: Right atrial size was moderately dilated. Pericardium: There is no evidence of pericardial effusion. Mitral Valve: The mitral valve is grossly normal. Mild mitral annular calcification. Mild mitral valve regurgitation. MV peak gradient, 6.2 mmHg. The mean mitral valve gradient is 2.0 mmHg. Tricuspid Valve: The tricuspid valve is grossly normal. Tricuspid valve regurgitation is moderate. Aortic Valve: The aortic valve is tricuspid. There is mild aortic valve annular calcification. Aortic valve regurgitation is mild. Aortic valve mean gradient measures 2.3 mmHg. Aortic valve peak gradient measures 5.4 mmHg. Aortic valve area, by VTI measures 2.04 cm. Pulmonic Valve: The pulmonic valve was grossly normal. Pulmonic valve regurgitation is trivial. Aorta: The aortic root is normal in size and structure. Venous: The inferior vena cava is dilated in size with less than 50% respiratory variability, suggesting right atrial pressure of 15 mmHg. IAS/Shunts: No atrial level shunt detected by color flow Doppler.  LEFT VENTRICLE PLAX 2D LVIDd:         4.90 cm   Diastology LVIDs:         3.95 cm   LV e' medial:    8.92 cm/s LV PW:          1.00 cm   LV E/e' medial:  13.0 LV IVS:        1.10 cm   LV e' lateral:   11.90 cm/s LVOT diam:     2.10 cm   LV E/e' lateral: 9.7 LV SV:         37 LV SV Index:   22 LVOT Area:     3.46 cm  RIGHT VENTRICLE RV Basal diam:  4.30 cm RV Mid diam:    3.60 cm RV S prime:     13.80 cm/s TAPSE (M-mode): 1.7 cm LEFT ATRIUM             Index        RIGHT ATRIUM  Index LA diam:        4.90 cm 2.88 cm/m   RA Area:     22.50 cm LA Vol (A2C):   55.3 ml 32.50 ml/m  RA Volume:   61.60 ml  36.20 ml/m LA Vol (A4C):   68.2 ml 40.08 ml/m LA Biplane Vol: 64.6 ml 37.96 ml/m  AORTIC VALVE                    PULMONIC VALVE AV Area (Vmax):    1.96 cm     PV Vmax:       0.54 m/s AV Area (Vmean):   1.91 cm     PV Peak grad:  1.1 mmHg AV Area (VTI):     2.04 cm AV Vmax:           116.67 cm/s AV Vmean:          75.067 cm/s AV VTI:            0.180 m AV Peak Grad:      5.4 mmHg AV Mean Grad:      2.3 mmHg LVOT Vmax:         66.10 cm/s LVOT Vmean:        41.500 cm/s LVOT VTI:          0.106 m LVOT/AV VTI ratio: 0.59  AORTA Ao Root diam: 2.60 cm Ao Asc diam:  3.10 cm MITRAL VALVE                TRICUSPID VALVE MV Area (PHT): 5.50 cm     TR Peak grad:   27.0 mmHg MV Area VTI:   1.44 cm     TR Vmax:        260.00 cm/s MV Peak grad:  6.2 mmHg MV Mean grad:  2.0 mmHg     SHUNTS MV Vmax:       1.24 m/s     Systemic VTI:  0.11 m MV Vmean:      63.3 cm/s    Systemic Diam: 2.10 cm MV Decel Time: 138 msec MV E velocity: 116.00 cm/s Rozann Lesches MD Electronically signed by Rozann Lesches MD Signature Date/Time: 10/26/2021/10:44:03 AM    Final      Discharge Exam: Vitals:   10/28/21 0957 10/28/21 1300  BP: (!) 141/101 140/74  Pulse: 82 75  Resp: 20 18  Temp:    SpO2: 95%    Vitals:   10/28/21 0809 10/28/21 0852 10/28/21 0957 10/28/21 1300  BP:  (!) 157/102 (!) 141/101 140/74  Pulse:  64 82 75  Resp:  18 20 18   Temp:      TempSrc:      SpO2: 92% 96% 95%   Weight:      Height:        General: Pt is  alert, awake, not in acute distress Cardiovascular: Irregular, S1/S2 +, no rubs, no gallops Respiratory: CTA bilaterally, no wheezing, no rhonchi Abdominal: Soft, NT, ND, bowel sounds + Extremities: no edema, no cyanosis    The results of significant diagnostics from this hospitalization (including imaging, microbiology, ancillary and laboratory) are listed below for reference.     Microbiology: Recent Results (from the past 240 hour(s))  Resp Panel by RT-PCR (Flu A&B, Covid) Nasopharyngeal Swab     Status: None   Collection Time: 10/25/21 12:13 PM   Specimen: Nasopharyngeal Swab; Nasopharyngeal(NP) swabs in vial transport medium  Result Value Ref Range Status   SARS Coronavirus 2 by  RT PCR NEGATIVE NEGATIVE Final    Comment: (NOTE) SARS-CoV-2 target nucleic acids are NOT DETECTED.  The SARS-CoV-2 RNA is generally detectable in upper respiratory specimens during the acute phase of infection. The lowest concentration of SARS-CoV-2 viral copies this assay can detect is 138 copies/mL. A negative result does not preclude SARS-Cov-2 infection and should not be used as the sole basis for treatment or other patient management decisions. A negative result may occur with  improper specimen collection/handling, submission of specimen other than nasopharyngeal swab, presence of viral mutation(s) within the areas targeted by this assay, and inadequate number of viral copies(<138 copies/mL). A negative result must be combined with clinical observations, patient history, and epidemiological information. The expected result is Negative.  Fact Sheet for Patients:  EntrepreneurPulse.com.au  Fact Sheet for Healthcare Providers:  IncredibleEmployment.be  This test is no t yet approved or cleared by the [place] FDA and  has been authorized for detection and/or diagnosis of SARS-CoV-2 by FDA under an Emergency Use Authorization (EUA). This EUA will remain   in effect (meaning this test can be used) for the duration of the COVID-19 declaration under Section 564(b)(1) of the Act, 21 U.S.C.section 360bbb-3(b)(1), unless the authorization is terminated  or revoked sooner.       Influenza A by PCR NEGATIVE NEGATIVE Final   Influenza B by PCR NEGATIVE NEGATIVE Final    Comment: (NOTE) The Xpert Xpress SARS-CoV-2/FLU/RSV plus assay is intended as an aid in the diagnosis of influenza from Nasopharyngeal swab specimens and should not be used as a sole basis for treatment. Nasal washings and aspirates are unacceptable for Xpert Xpress SARS-CoV-2/FLU/RSV testing.  Fact Sheet for Patients: EntrepreneurPulse.com.au  Fact Sheet for Healthcare Providers: IncredibleEmployment.be  This test is not yet approved or cleared by the [place] FDA and has been authorized for detection and/or diagnosis of SARS-CoV-2 by FDA under an Emergency Use Authorization (EUA). This EUA will remain in effect (meaning this test can be used) for the duration of the COVID-19 declaration under Section 564(b)(1) of the Act, 21 U.S.C. section 360bbb-3(b)(1), unless the authorization is terminated or revoked.  Performed at [facility], [address] [place], [place] [address]   MRSA Next Gen by PCR, Nasal     Status: None   Collection Time: 10/26/21 12:04 AM   Specimen: Nasal Mucosa; Nasal Swab  Result Value Ref Range Status   MRSA by PCR Next Gen NOT DETECTED NOT DETECTED Final    Comment: (NOTE) The GeneXpert MRSA Assay (FDA approved for NASAL specimens only), is one component of a comprehensive MRSA colonization surveillance program. It is not intended to diagnose MRSA infection nor to guide or monitor treatment for MRSA infections. Test performance is not FDA approved in patients less than 69 years old. Performed at [facility], [address], [place], [place] [address]      Labs: BNP (last 3 results) Recent Labs     10/25/21 1203  BNP 0,258.5*   Basic Metabolic Panel: Recent Labs  Lab 10/25/21 1202 10/26/21 0403 10/27/21 0435  NA 137 139 138  K 4.0 2.7* 3.7  CL 102 102 101  CO2 25 27 28   GLUCOSE 153* 153* 124*  BUN 13 11 7*  CREATININE 0.88 0.62 0.58  CALCIUM 8.6* 8.3* 8.7*  MG  --  1.6* 1.7  PHOS  --  2.6  --    Liver Function Tests: Recent Labs  Lab 10/25/21 1202 10/26/21 0403 10/27/21 0435  AST 64* 65* 52*  ALT 71* 76* 66*  ALKPHOS 135* 131* 126  BILITOT 1.9* 1.9* 2.2*  PROT 6.0* 5.8* 5.9*  ALBUMIN 3.7 3.6 3.6   No results for input(s): LIPASE, AMYLASE in the last 168 hours. No results for input(s): AMMONIA in the last 168 hours. CBC: Recent Labs  Lab 10/25/21 1202 10/26/21 0403 10/27/21 0435  WBC 9.8 8.7 9.3  NEUTROABS 5.1  --   --   HGB 15.5* 14.6 15.5*  HCT 48.2* 45.0 46.9*  MCV 98.0 98.0 97.1  PLT 247 215 230   Cardiac Enzymes: No results for input(s): CKTOTAL, CKMB, CKMBINDEX, TROPONINI in the last 168 hours. BNP: Invalid input(s): POCBNP CBG: Recent Labs  Lab 10/27/21 1143 10/27/21 1632 10/27/21 2102 10/28/21 0734 10/28/21 1157  GLUCAP 175* 189* 161* 156* 223*   D-Dimer No results for input(s): DDIMER in the last 72 hours. Hgb A1c No results for input(s): HGBA1C in the last 72 hours. Lipid Profile No results for input(s): CHOL, HDL, LDLCALC, TRIG, CHOLHDL, LDLDIRECT in the last 72 hours. Thyroid function studies No results for input(s): TSH, T4TOTAL, T3FREE, THYROIDAB in the last 72 hours.  Invalid input(s): FREET3 Anemia work up No results for input(s): VITAMINB12, FOLATE, FERRITIN, TIBC, IRON, RETICCTPCT in the last 72 hours. Urinalysis No results found for: COLORURINE, APPEARANCEUR, [place], [place], GLUCOSEU, [place], [place], [place], PROTEINUR, UROBILINOGEN, NITRITE, LEUKOCYTESUR Sepsis Labs Invalid input(s): PROCALCITONIN,  WBC,  LACTICIDVEN Microbiology Recent Results (from the past 240 hour(s))  Resp Panel by RT-PCR (Flu A&B,  Covid) Nasopharyngeal Swab     Status: None   Collection Time: 10/25/21 12:13 PM   Specimen: Nasopharyngeal Swab; Nasopharyngeal(NP) swabs in vial transport medium  Result Value Ref Range Status   SARS Coronavirus 2 by RT PCR NEGATIVE NEGATIVE Final    Comment: (NOTE) SARS-CoV-2 target nucleic acids are NOT DETECTED.  The SARS-CoV-2 RNA is generally detectable in upper respiratory specimens during the acute phase of infection. The lowest concentration of SARS-CoV-2 viral copies this assay can detect is 138 copies/mL. A negative result does not preclude SARS-Cov-2 infection and should not be used as the sole basis for treatment or other patient management decisions. A negative result may occur with  improper specimen collection/handling, submission of specimen other than nasopharyngeal swab, presence of viral mutation(s) within the areas targeted by this assay, and inadequate number of viral copies(<138 copies/mL). A negative result must be combined with clinical observations, patient history, and epidemiological information. The expected result is Negative.  Fact Sheet for Patients:  EntrepreneurPulse.com.au  Fact Sheet for Healthcare Providers:  IncredibleEmployment.be  This test is no t yet approved or cleared by the [place] FDA and  has been authorized for detection and/or diagnosis of SARS-CoV-2 by FDA under an Emergency Use Authorization (EUA). This EUA will remain  in effect (meaning this test can be used) for the duration of the COVID-19 declaration under Section 564(b)(1) of the Act, 21 U.S.C.section 360bbb-3(b)(1), unless the authorization is terminated  or revoked sooner.       Influenza A by PCR NEGATIVE NEGATIVE Final   Influenza B by PCR NEGATIVE NEGATIVE Final    Comment: (NOTE) The Xpert Xpress SARS-CoV-2/FLU/RSV plus assay is intended as an aid in the diagnosis of influenza from Nasopharyngeal swab specimens and should  not be used as a sole basis for treatment. Nasal washings and aspirates are unacceptable for Xpert Xpress SARS-CoV-2/FLU/RSV testing.  Fact Sheet for Patients: EntrepreneurPulse.com.au  Fact Sheet for Healthcare Providers: IncredibleEmployment.be  This test is not yet approved or  cleared by the [place] and has been authorized for detection and/or diagnosis of SARS-CoV-2 by FDA under an Emergency Use Authorization (EUA). This EUA will remain in effect (meaning this test can be used) for the duration of the COVID-19 declaration under Section 564(b)(1) of the Act, 21 U.S.C. section 360bbb-3(b)(1), unless the authorization is terminated or revoked.  Performed at [facility], [address], [place], [place] [address]   MRSA Next Gen by PCR, Nasal     Status: None   Collection Time: 10/26/21 12:04 AM   Specimen: Nasal Mucosa; Nasal Swab  Result Value Ref Range Status   MRSA by PCR Next Gen NOT DETECTED NOT DETECTED Final    Comment: (NOTE) The GeneXpert MRSA Assay (FDA approved for NASAL specimens only), is one component of a comprehensive MRSA colonization surveillance program. It is not intended to diagnose MRSA infection nor to guide or monitor treatment for MRSA infections. Test performance is not FDA approved in patients less than 61 years old. Performed at [facility], [address] [place], [place] [address]      Time coordinating discharge: 35 minutes  SIGNED:   Rodena Goldmann, DO [facility] 10/28/2021, 1:41 PM  If 7PM-7AM, please contact night-coverage [email or web address]

## 2021-10-29 DIAGNOSIS — Z853 Personal history of malignant neoplasm of breast: Secondary | ICD-10-CM | POA: Diagnosis not present

## 2021-10-29 DIAGNOSIS — Z794 Long term (current) use of insulin: Secondary | ICD-10-CM | POA: Diagnosis not present

## 2021-10-29 DIAGNOSIS — R296 Repeated falls: Secondary | ICD-10-CM | POA: Diagnosis not present

## 2021-10-29 DIAGNOSIS — Z7901 Long term (current) use of anticoagulants: Secondary | ICD-10-CM | POA: Diagnosis not present

## 2021-10-29 DIAGNOSIS — J410 Simple chronic bronchitis: Secondary | ICD-10-CM | POA: Diagnosis not present

## 2021-10-29 DIAGNOSIS — R7401 Elevation of levels of liver transaminase levels: Secondary | ICD-10-CM | POA: Diagnosis not present

## 2021-10-29 DIAGNOSIS — Z8616 Personal history of COVID-19: Secondary | ICD-10-CM | POA: Diagnosis not present

## 2021-10-29 DIAGNOSIS — I10 Essential (primary) hypertension: Secondary | ICD-10-CM | POA: Diagnosis not present

## 2021-10-29 DIAGNOSIS — I480 Paroxysmal atrial fibrillation: Secondary | ICD-10-CM | POA: Diagnosis not present

## 2021-10-29 DIAGNOSIS — E782 Mixed hyperlipidemia: Secondary | ICD-10-CM | POA: Diagnosis not present

## 2021-10-29 DIAGNOSIS — I5021 Acute systolic (congestive) heart failure: Secondary | ICD-10-CM | POA: Diagnosis not present

## 2021-10-29 DIAGNOSIS — Z602 Problems related to living alone: Secondary | ICD-10-CM | POA: Diagnosis not present

## 2021-10-29 DIAGNOSIS — E1165 Type 2 diabetes mellitus with hyperglycemia: Secondary | ICD-10-CM | POA: Diagnosis not present

## 2021-10-29 DIAGNOSIS — F32A Depression, unspecified: Secondary | ICD-10-CM | POA: Diagnosis not present

## 2021-10-29 DIAGNOSIS — I110 Hypertensive heart disease with heart failure: Secondary | ICD-10-CM | POA: Diagnosis not present

## 2021-10-29 DIAGNOSIS — E039 Hypothyroidism, unspecified: Secondary | ICD-10-CM | POA: Diagnosis not present

## 2021-10-30 DIAGNOSIS — E782 Mixed hyperlipidemia: Secondary | ICD-10-CM | POA: Diagnosis not present

## 2021-10-30 DIAGNOSIS — R296 Repeated falls: Secondary | ICD-10-CM | POA: Diagnosis not present

## 2021-10-30 DIAGNOSIS — I10 Essential (primary) hypertension: Secondary | ICD-10-CM | POA: Diagnosis not present

## 2021-10-30 DIAGNOSIS — Z853 Personal history of malignant neoplasm of breast: Secondary | ICD-10-CM | POA: Diagnosis not present

## 2021-10-30 DIAGNOSIS — Z602 Problems related to living alone: Secondary | ICD-10-CM | POA: Diagnosis not present

## 2021-10-30 DIAGNOSIS — E1165 Type 2 diabetes mellitus with hyperglycemia: Secondary | ICD-10-CM | POA: Diagnosis not present

## 2021-10-30 DIAGNOSIS — I480 Paroxysmal atrial fibrillation: Secondary | ICD-10-CM | POA: Diagnosis not present

## 2021-10-30 DIAGNOSIS — I110 Hypertensive heart disease with heart failure: Secondary | ICD-10-CM | POA: Diagnosis not present

## 2021-10-30 DIAGNOSIS — Z7901 Long term (current) use of anticoagulants: Secondary | ICD-10-CM | POA: Diagnosis not present

## 2021-10-30 DIAGNOSIS — Z794 Long term (current) use of insulin: Secondary | ICD-10-CM | POA: Diagnosis not present

## 2021-10-30 DIAGNOSIS — I5021 Acute systolic (congestive) heart failure: Secondary | ICD-10-CM | POA: Diagnosis not present

## 2021-10-30 DIAGNOSIS — Z8616 Personal history of COVID-19: Secondary | ICD-10-CM | POA: Diagnosis not present

## 2021-10-30 DIAGNOSIS — J410 Simple chronic bronchitis: Secondary | ICD-10-CM | POA: Diagnosis not present

## 2021-10-30 DIAGNOSIS — E039 Hypothyroidism, unspecified: Secondary | ICD-10-CM | POA: Diagnosis not present

## 2021-10-30 DIAGNOSIS — R7401 Elevation of levels of liver transaminase levels: Secondary | ICD-10-CM | POA: Diagnosis not present

## 2021-10-30 DIAGNOSIS — F32A Depression, unspecified: Secondary | ICD-10-CM | POA: Diagnosis not present

## 2021-11-06 DIAGNOSIS — I110 Hypertensive heart disease with heart failure: Secondary | ICD-10-CM | POA: Diagnosis not present

## 2021-11-06 DIAGNOSIS — E782 Mixed hyperlipidemia: Secondary | ICD-10-CM | POA: Diagnosis not present

## 2021-11-06 DIAGNOSIS — Z7901 Long term (current) use of anticoagulants: Secondary | ICD-10-CM | POA: Diagnosis not present

## 2021-11-06 DIAGNOSIS — Z602 Problems related to living alone: Secondary | ICD-10-CM | POA: Diagnosis not present

## 2021-11-06 DIAGNOSIS — Z8616 Personal history of COVID-19: Secondary | ICD-10-CM | POA: Diagnosis not present

## 2021-11-06 DIAGNOSIS — Z853 Personal history of malignant neoplasm of breast: Secondary | ICD-10-CM | POA: Diagnosis not present

## 2021-11-06 DIAGNOSIS — I5021 Acute systolic (congestive) heart failure: Secondary | ICD-10-CM | POA: Diagnosis not present

## 2021-11-06 DIAGNOSIS — J410 Simple chronic bronchitis: Secondary | ICD-10-CM | POA: Diagnosis not present

## 2021-11-06 DIAGNOSIS — I480 Paroxysmal atrial fibrillation: Secondary | ICD-10-CM | POA: Diagnosis not present

## 2021-11-06 DIAGNOSIS — E1165 Type 2 diabetes mellitus with hyperglycemia: Secondary | ICD-10-CM | POA: Diagnosis not present

## 2021-11-06 DIAGNOSIS — R7401 Elevation of levels of liver transaminase levels: Secondary | ICD-10-CM | POA: Diagnosis not present

## 2021-11-06 DIAGNOSIS — Z794 Long term (current) use of insulin: Secondary | ICD-10-CM | POA: Diagnosis not present

## 2021-11-06 DIAGNOSIS — E039 Hypothyroidism, unspecified: Secondary | ICD-10-CM | POA: Diagnosis not present

## 2021-11-06 DIAGNOSIS — R296 Repeated falls: Secondary | ICD-10-CM | POA: Diagnosis not present

## 2021-11-06 DIAGNOSIS — F32A Depression, unspecified: Secondary | ICD-10-CM | POA: Diagnosis not present

## 2021-11-07 DIAGNOSIS — I5023 Acute on chronic systolic (congestive) heart failure: Secondary | ICD-10-CM | POA: Diagnosis not present

## 2021-11-07 DIAGNOSIS — I480 Paroxysmal atrial fibrillation: Secondary | ICD-10-CM | POA: Diagnosis not present

## 2021-11-08 DIAGNOSIS — Z794 Long term (current) use of insulin: Secondary | ICD-10-CM | POA: Diagnosis not present

## 2021-11-08 DIAGNOSIS — Z7901 Long term (current) use of anticoagulants: Secondary | ICD-10-CM | POA: Diagnosis not present

## 2021-11-08 DIAGNOSIS — J410 Simple chronic bronchitis: Secondary | ICD-10-CM | POA: Diagnosis not present

## 2021-11-08 DIAGNOSIS — E782 Mixed hyperlipidemia: Secondary | ICD-10-CM | POA: Diagnosis not present

## 2021-11-08 DIAGNOSIS — R7401 Elevation of levels of liver transaminase levels: Secondary | ICD-10-CM | POA: Diagnosis not present

## 2021-11-08 DIAGNOSIS — E039 Hypothyroidism, unspecified: Secondary | ICD-10-CM | POA: Diagnosis not present

## 2021-11-08 DIAGNOSIS — Z602 Problems related to living alone: Secondary | ICD-10-CM | POA: Diagnosis not present

## 2021-11-08 DIAGNOSIS — I110 Hypertensive heart disease with heart failure: Secondary | ICD-10-CM | POA: Diagnosis not present

## 2021-11-08 DIAGNOSIS — I480 Paroxysmal atrial fibrillation: Secondary | ICD-10-CM | POA: Diagnosis not present

## 2021-11-08 DIAGNOSIS — R296 Repeated falls: Secondary | ICD-10-CM | POA: Diagnosis not present

## 2021-11-08 DIAGNOSIS — Z8616 Personal history of COVID-19: Secondary | ICD-10-CM | POA: Diagnosis not present

## 2021-11-08 DIAGNOSIS — Z853 Personal history of malignant neoplasm of breast: Secondary | ICD-10-CM | POA: Diagnosis not present

## 2021-11-08 DIAGNOSIS — E1165 Type 2 diabetes mellitus with hyperglycemia: Secondary | ICD-10-CM | POA: Diagnosis not present

## 2021-11-08 DIAGNOSIS — I5021 Acute systolic (congestive) heart failure: Secondary | ICD-10-CM | POA: Diagnosis not present

## 2021-11-08 DIAGNOSIS — F32A Depression, unspecified: Secondary | ICD-10-CM | POA: Diagnosis not present

## 2021-11-13 DIAGNOSIS — E1165 Type 2 diabetes mellitus with hyperglycemia: Secondary | ICD-10-CM | POA: Diagnosis not present

## 2021-11-13 DIAGNOSIS — Z794 Long term (current) use of insulin: Secondary | ICD-10-CM | POA: Diagnosis not present

## 2021-11-13 DIAGNOSIS — Z8616 Personal history of COVID-19: Secondary | ICD-10-CM | POA: Diagnosis not present

## 2021-11-13 DIAGNOSIS — Z853 Personal history of malignant neoplasm of breast: Secondary | ICD-10-CM | POA: Diagnosis not present

## 2021-11-13 DIAGNOSIS — I110 Hypertensive heart disease with heart failure: Secondary | ICD-10-CM | POA: Diagnosis not present

## 2021-11-13 DIAGNOSIS — R7401 Elevation of levels of liver transaminase levels: Secondary | ICD-10-CM | POA: Diagnosis not present

## 2021-11-13 DIAGNOSIS — F32A Depression, unspecified: Secondary | ICD-10-CM | POA: Diagnosis not present

## 2021-11-13 DIAGNOSIS — E782 Mixed hyperlipidemia: Secondary | ICD-10-CM | POA: Diagnosis not present

## 2021-11-13 DIAGNOSIS — Z7901 Long term (current) use of anticoagulants: Secondary | ICD-10-CM | POA: Diagnosis not present

## 2021-11-13 DIAGNOSIS — I480 Paroxysmal atrial fibrillation: Secondary | ICD-10-CM | POA: Diagnosis not present

## 2021-11-13 DIAGNOSIS — J410 Simple chronic bronchitis: Secondary | ICD-10-CM | POA: Diagnosis not present

## 2021-11-13 DIAGNOSIS — Z602 Problems related to living alone: Secondary | ICD-10-CM | POA: Diagnosis not present

## 2021-11-13 DIAGNOSIS — E039 Hypothyroidism, unspecified: Secondary | ICD-10-CM | POA: Diagnosis not present

## 2021-11-13 DIAGNOSIS — R296 Repeated falls: Secondary | ICD-10-CM | POA: Diagnosis not present

## 2021-11-13 DIAGNOSIS — I5021 Acute systolic (congestive) heart failure: Secondary | ICD-10-CM | POA: Diagnosis not present

## 2021-11-15 DIAGNOSIS — Z8616 Personal history of COVID-19: Secondary | ICD-10-CM | POA: Diagnosis not present

## 2021-11-15 DIAGNOSIS — R7401 Elevation of levels of liver transaminase levels: Secondary | ICD-10-CM | POA: Diagnosis not present

## 2021-11-15 DIAGNOSIS — I5021 Acute systolic (congestive) heart failure: Secondary | ICD-10-CM | POA: Diagnosis not present

## 2021-11-15 DIAGNOSIS — Z7901 Long term (current) use of anticoagulants: Secondary | ICD-10-CM | POA: Diagnosis not present

## 2021-11-15 DIAGNOSIS — R296 Repeated falls: Secondary | ICD-10-CM | POA: Diagnosis not present

## 2021-11-15 DIAGNOSIS — Z853 Personal history of malignant neoplasm of breast: Secondary | ICD-10-CM | POA: Diagnosis not present

## 2021-11-15 DIAGNOSIS — I480 Paroxysmal atrial fibrillation: Secondary | ICD-10-CM | POA: Diagnosis not present

## 2021-11-15 DIAGNOSIS — I110 Hypertensive heart disease with heart failure: Secondary | ICD-10-CM | POA: Diagnosis not present

## 2021-11-15 DIAGNOSIS — E1165 Type 2 diabetes mellitus with hyperglycemia: Secondary | ICD-10-CM | POA: Diagnosis not present

## 2021-11-15 DIAGNOSIS — J410 Simple chronic bronchitis: Secondary | ICD-10-CM | POA: Diagnosis not present

## 2021-11-15 DIAGNOSIS — E039 Hypothyroidism, unspecified: Secondary | ICD-10-CM | POA: Diagnosis not present

## 2021-11-15 DIAGNOSIS — Z602 Problems related to living alone: Secondary | ICD-10-CM | POA: Diagnosis not present

## 2021-11-15 DIAGNOSIS — E782 Mixed hyperlipidemia: Secondary | ICD-10-CM | POA: Diagnosis not present

## 2021-11-15 DIAGNOSIS — F32A Depression, unspecified: Secondary | ICD-10-CM | POA: Diagnosis not present

## 2021-11-15 DIAGNOSIS — Z794 Long term (current) use of insulin: Secondary | ICD-10-CM | POA: Diagnosis not present

## 2021-11-22 DIAGNOSIS — F32A Depression, unspecified: Secondary | ICD-10-CM | POA: Diagnosis not present

## 2021-11-22 DIAGNOSIS — Z853 Personal history of malignant neoplasm of breast: Secondary | ICD-10-CM | POA: Diagnosis not present

## 2021-11-22 DIAGNOSIS — R7401 Elevation of levels of liver transaminase levels: Secondary | ICD-10-CM | POA: Diagnosis not present

## 2021-11-22 DIAGNOSIS — Z7901 Long term (current) use of anticoagulants: Secondary | ICD-10-CM | POA: Diagnosis not present

## 2021-11-22 DIAGNOSIS — I110 Hypertensive heart disease with heart failure: Secondary | ICD-10-CM | POA: Diagnosis not present

## 2021-11-22 DIAGNOSIS — J410 Simple chronic bronchitis: Secondary | ICD-10-CM | POA: Diagnosis not present

## 2021-11-22 DIAGNOSIS — I480 Paroxysmal atrial fibrillation: Secondary | ICD-10-CM | POA: Diagnosis not present

## 2021-11-22 DIAGNOSIS — Z794 Long term (current) use of insulin: Secondary | ICD-10-CM | POA: Diagnosis not present

## 2021-11-22 DIAGNOSIS — E039 Hypothyroidism, unspecified: Secondary | ICD-10-CM | POA: Diagnosis not present

## 2021-11-22 DIAGNOSIS — I5021 Acute systolic (congestive) heart failure: Secondary | ICD-10-CM | POA: Diagnosis not present

## 2021-11-22 DIAGNOSIS — Z602 Problems related to living alone: Secondary | ICD-10-CM | POA: Diagnosis not present

## 2021-11-22 DIAGNOSIS — E782 Mixed hyperlipidemia: Secondary | ICD-10-CM | POA: Diagnosis not present

## 2021-11-22 DIAGNOSIS — R296 Repeated falls: Secondary | ICD-10-CM | POA: Diagnosis not present

## 2021-11-22 DIAGNOSIS — Z8616 Personal history of COVID-19: Secondary | ICD-10-CM | POA: Diagnosis not present

## 2021-11-22 DIAGNOSIS — E1165 Type 2 diabetes mellitus with hyperglycemia: Secondary | ICD-10-CM | POA: Diagnosis not present

## 2021-12-05 DIAGNOSIS — Z794 Long term (current) use of insulin: Secondary | ICD-10-CM | POA: Diagnosis not present

## 2021-12-05 DIAGNOSIS — Z602 Problems related to living alone: Secondary | ICD-10-CM | POA: Diagnosis not present

## 2021-12-05 DIAGNOSIS — Z7901 Long term (current) use of anticoagulants: Secondary | ICD-10-CM | POA: Diagnosis not present

## 2021-12-05 DIAGNOSIS — R296 Repeated falls: Secondary | ICD-10-CM | POA: Diagnosis not present

## 2021-12-05 DIAGNOSIS — Z8616 Personal history of COVID-19: Secondary | ICD-10-CM | POA: Diagnosis not present

## 2021-12-05 DIAGNOSIS — I480 Paroxysmal atrial fibrillation: Secondary | ICD-10-CM | POA: Diagnosis not present

## 2021-12-05 DIAGNOSIS — I5021 Acute systolic (congestive) heart failure: Secondary | ICD-10-CM | POA: Diagnosis not present

## 2021-12-05 DIAGNOSIS — E1165 Type 2 diabetes mellitus with hyperglycemia: Secondary | ICD-10-CM | POA: Diagnosis not present

## 2021-12-05 DIAGNOSIS — Z853 Personal history of malignant neoplasm of breast: Secondary | ICD-10-CM | POA: Diagnosis not present

## 2021-12-05 DIAGNOSIS — E782 Mixed hyperlipidemia: Secondary | ICD-10-CM | POA: Diagnosis not present

## 2021-12-05 DIAGNOSIS — F32A Depression, unspecified: Secondary | ICD-10-CM | POA: Diagnosis not present

## 2021-12-05 DIAGNOSIS — I110 Hypertensive heart disease with heart failure: Secondary | ICD-10-CM | POA: Diagnosis not present

## 2021-12-05 DIAGNOSIS — E039 Hypothyroidism, unspecified: Secondary | ICD-10-CM | POA: Diagnosis not present

## 2021-12-05 DIAGNOSIS — J410 Simple chronic bronchitis: Secondary | ICD-10-CM | POA: Diagnosis not present

## 2021-12-05 DIAGNOSIS — R7401 Elevation of levels of liver transaminase levels: Secondary | ICD-10-CM | POA: Diagnosis not present

## 2021-12-13 DIAGNOSIS — R296 Repeated falls: Secondary | ICD-10-CM | POA: Diagnosis not present

## 2021-12-13 DIAGNOSIS — J410 Simple chronic bronchitis: Secondary | ICD-10-CM | POA: Diagnosis not present

## 2021-12-13 DIAGNOSIS — Z853 Personal history of malignant neoplasm of breast: Secondary | ICD-10-CM | POA: Diagnosis not present

## 2021-12-13 DIAGNOSIS — I480 Paroxysmal atrial fibrillation: Secondary | ICD-10-CM | POA: Diagnosis not present

## 2021-12-13 DIAGNOSIS — Z602 Problems related to living alone: Secondary | ICD-10-CM | POA: Diagnosis not present

## 2021-12-13 DIAGNOSIS — R7401 Elevation of levels of liver transaminase levels: Secondary | ICD-10-CM | POA: Diagnosis not present

## 2021-12-13 DIAGNOSIS — Z794 Long term (current) use of insulin: Secondary | ICD-10-CM | POA: Diagnosis not present

## 2021-12-13 DIAGNOSIS — Z7901 Long term (current) use of anticoagulants: Secondary | ICD-10-CM | POA: Diagnosis not present

## 2021-12-13 DIAGNOSIS — E1165 Type 2 diabetes mellitus with hyperglycemia: Secondary | ICD-10-CM | POA: Diagnosis not present

## 2021-12-13 DIAGNOSIS — E039 Hypothyroidism, unspecified: Secondary | ICD-10-CM | POA: Diagnosis not present

## 2021-12-13 DIAGNOSIS — F32A Depression, unspecified: Secondary | ICD-10-CM | POA: Diagnosis not present

## 2021-12-13 DIAGNOSIS — Z8616 Personal history of COVID-19: Secondary | ICD-10-CM | POA: Diagnosis not present

## 2021-12-13 DIAGNOSIS — I5021 Acute systolic (congestive) heart failure: Secondary | ICD-10-CM | POA: Diagnosis not present

## 2021-12-13 DIAGNOSIS — I110 Hypertensive heart disease with heart failure: Secondary | ICD-10-CM | POA: Diagnosis not present

## 2021-12-13 DIAGNOSIS — E782 Mixed hyperlipidemia: Secondary | ICD-10-CM | POA: Diagnosis not present

## 2021-12-19 DIAGNOSIS — R7401 Elevation of levels of liver transaminase levels: Secondary | ICD-10-CM | POA: Diagnosis not present

## 2021-12-19 DIAGNOSIS — Z794 Long term (current) use of insulin: Secondary | ICD-10-CM | POA: Diagnosis not present

## 2021-12-19 DIAGNOSIS — Z853 Personal history of malignant neoplasm of breast: Secondary | ICD-10-CM | POA: Diagnosis not present

## 2021-12-19 DIAGNOSIS — E782 Mixed hyperlipidemia: Secondary | ICD-10-CM | POA: Diagnosis not present

## 2021-12-19 DIAGNOSIS — I5021 Acute systolic (congestive) heart failure: Secondary | ICD-10-CM | POA: Diagnosis not present

## 2021-12-19 DIAGNOSIS — I110 Hypertensive heart disease with heart failure: Secondary | ICD-10-CM | POA: Diagnosis not present

## 2021-12-19 DIAGNOSIS — R296 Repeated falls: Secondary | ICD-10-CM | POA: Diagnosis not present

## 2021-12-19 DIAGNOSIS — E1165 Type 2 diabetes mellitus with hyperglycemia: Secondary | ICD-10-CM | POA: Diagnosis not present

## 2021-12-19 DIAGNOSIS — F32A Depression, unspecified: Secondary | ICD-10-CM | POA: Diagnosis not present

## 2021-12-19 DIAGNOSIS — Z602 Problems related to living alone: Secondary | ICD-10-CM | POA: Diagnosis not present

## 2021-12-19 DIAGNOSIS — E039 Hypothyroidism, unspecified: Secondary | ICD-10-CM | POA: Diagnosis not present

## 2021-12-19 DIAGNOSIS — I480 Paroxysmal atrial fibrillation: Secondary | ICD-10-CM | POA: Diagnosis not present

## 2021-12-19 DIAGNOSIS — Z8616 Personal history of COVID-19: Secondary | ICD-10-CM | POA: Diagnosis not present

## 2021-12-19 DIAGNOSIS — J410 Simple chronic bronchitis: Secondary | ICD-10-CM | POA: Diagnosis not present

## 2021-12-19 DIAGNOSIS — Z7901 Long term (current) use of anticoagulants: Secondary | ICD-10-CM | POA: Diagnosis not present

## 2022-01-22 DIAGNOSIS — I5023 Acute on chronic systolic (congestive) heart failure: Secondary | ICD-10-CM | POA: Diagnosis not present

## 2022-01-22 DIAGNOSIS — I480 Paroxysmal atrial fibrillation: Secondary | ICD-10-CM | POA: Diagnosis not present

## 2022-01-22 DIAGNOSIS — E1169 Type 2 diabetes mellitus with other specified complication: Secondary | ICD-10-CM | POA: Diagnosis not present

## 2022-01-22 DIAGNOSIS — I10 Essential (primary) hypertension: Secondary | ICD-10-CM | POA: Diagnosis not present

## 2022-01-22 DIAGNOSIS — E038 Other specified hypothyroidism: Secondary | ICD-10-CM | POA: Diagnosis not present

## 2022-01-22 DIAGNOSIS — Z0000 Encounter for general adult medical examination without abnormal findings: Secondary | ICD-10-CM | POA: Diagnosis not present

## 2022-01-24 DIAGNOSIS — Z1231 Encounter for screening mammogram for malignant neoplasm of breast: Secondary | ICD-10-CM | POA: Diagnosis not present

## 2022-01-28 DIAGNOSIS — I4811 Longstanding persistent atrial fibrillation: Secondary | ICD-10-CM | POA: Diagnosis not present

## 2022-01-28 DIAGNOSIS — I2510 Atherosclerotic heart disease of native coronary artery without angina pectoris: Secondary | ICD-10-CM | POA: Diagnosis not present

## 2022-01-28 DIAGNOSIS — I5020 Unspecified systolic (congestive) heart failure: Secondary | ICD-10-CM | POA: Diagnosis not present

## 2022-01-28 DIAGNOSIS — E785 Hyperlipidemia, unspecified: Secondary | ICD-10-CM | POA: Diagnosis not present

## 2022-01-28 DIAGNOSIS — I10 Essential (primary) hypertension: Secondary | ICD-10-CM | POA: Diagnosis not present

## 2022-02-06 DIAGNOSIS — I5020 Unspecified systolic (congestive) heart failure: Secondary | ICD-10-CM | POA: Diagnosis not present

## 2022-02-06 DIAGNOSIS — I110 Hypertensive heart disease with heart failure: Secondary | ICD-10-CM | POA: Diagnosis not present

## 2022-02-06 DIAGNOSIS — I2510 Atherosclerotic heart disease of native coronary artery without angina pectoris: Secondary | ICD-10-CM | POA: Diagnosis not present

## 2022-02-06 DIAGNOSIS — I4811 Longstanding persistent atrial fibrillation: Secondary | ICD-10-CM | POA: Diagnosis not present

## 2022-02-11 DIAGNOSIS — Z961 Presence of intraocular lens: Secondary | ICD-10-CM | POA: Diagnosis not present

## 2022-02-11 DIAGNOSIS — H01002 Unspecified blepharitis right lower eyelid: Secondary | ICD-10-CM | POA: Diagnosis not present

## 2022-02-11 DIAGNOSIS — H01001 Unspecified blepharitis right upper eyelid: Secondary | ICD-10-CM | POA: Diagnosis not present

## 2022-02-11 DIAGNOSIS — H26491 Other secondary cataract, right eye: Secondary | ICD-10-CM | POA: Diagnosis not present

## 2022-02-11 DIAGNOSIS — H01004 Unspecified blepharitis left upper eyelid: Secondary | ICD-10-CM | POA: Diagnosis not present

## 2022-02-28 DIAGNOSIS — Z961 Presence of intraocular lens: Secondary | ICD-10-CM | POA: Diagnosis not present

## 2022-04-15 DIAGNOSIS — I5020 Unspecified systolic (congestive) heart failure: Secondary | ICD-10-CM | POA: Diagnosis not present

## 2022-04-15 DIAGNOSIS — I4811 Longstanding persistent atrial fibrillation: Secondary | ICD-10-CM | POA: Diagnosis not present

## 2022-04-15 DIAGNOSIS — I10 Essential (primary) hypertension: Secondary | ICD-10-CM | POA: Diagnosis not present

## 2022-04-15 DIAGNOSIS — I2510 Atherosclerotic heart disease of native coronary artery without angina pectoris: Secondary | ICD-10-CM | POA: Diagnosis not present

## 2022-05-23 DIAGNOSIS — E038 Other specified hypothyroidism: Secondary | ICD-10-CM | POA: Diagnosis not present

## 2022-05-23 DIAGNOSIS — I10 Essential (primary) hypertension: Secondary | ICD-10-CM | POA: Diagnosis not present

## 2022-05-23 DIAGNOSIS — I5023 Acute on chronic systolic (congestive) heart failure: Secondary | ICD-10-CM | POA: Diagnosis not present

## 2022-05-23 DIAGNOSIS — I480 Paroxysmal atrial fibrillation: Secondary | ICD-10-CM | POA: Diagnosis not present

## 2022-05-23 DIAGNOSIS — E1169 Type 2 diabetes mellitus with other specified complication: Secondary | ICD-10-CM | POA: Diagnosis not present

## 2022-07-15 IMAGING — DX DG CHEST 1V PORT
1 series · 1 of 1 positions shown · non-contrast
Comparison: None.

CLINICAL DATA: Shortness of breath

EXAM:
PORTABLE CHEST 1 VIEW

[chest ap]
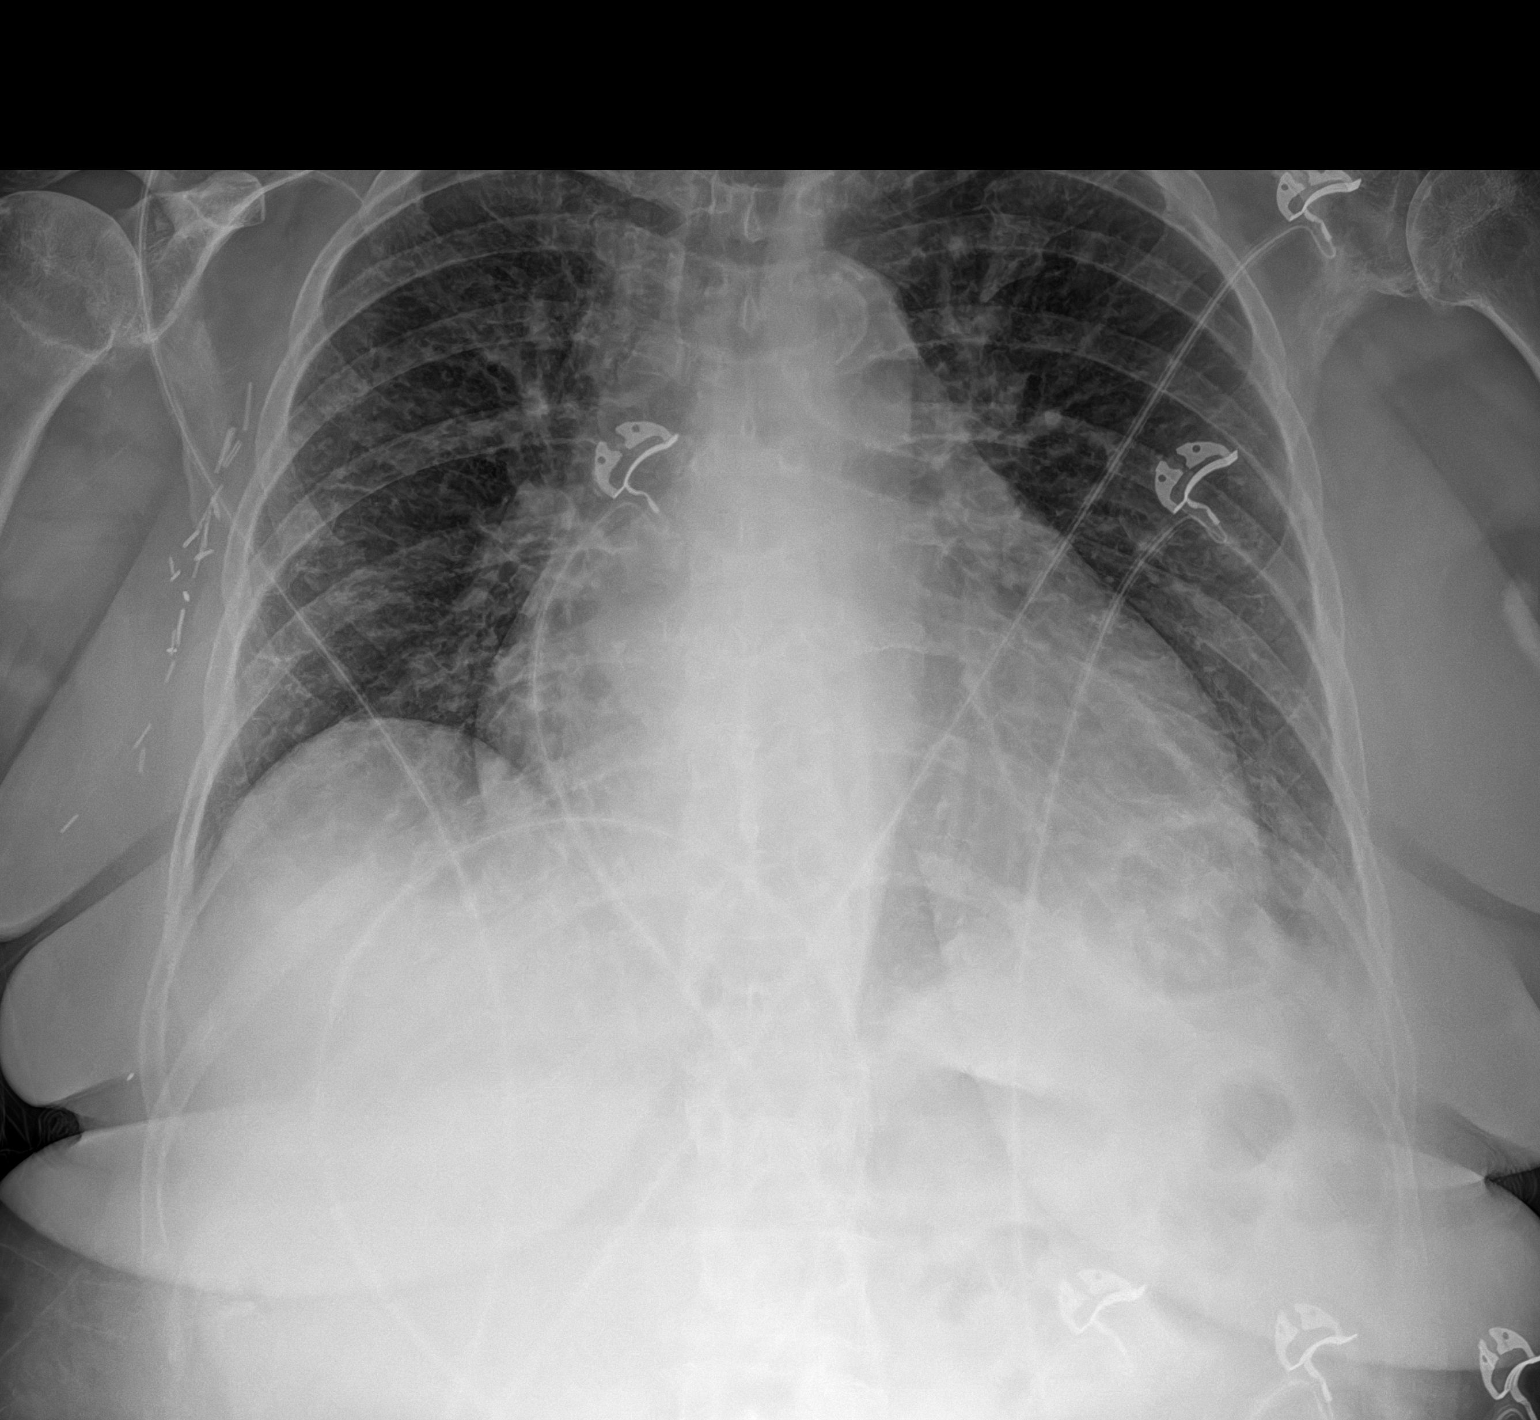

[1 of 1 positions shown; findings below may reference images not displayed]

FINDINGS: Cardiomegaly. Atherosclerotic calcification of the aortic knob. Mild
diffuse interstitial prominence. No lobar consolidation. No pleural
effusion or pneumothorax. Surgical clips in the right axillary
region.
IMPRESSION: Cardiomegaly with diffuse interstitial prominence suggesting mild
edema.

## 2022-07-16 DIAGNOSIS — I10 Essential (primary) hypertension: Secondary | ICD-10-CM | POA: Diagnosis not present

## 2022-07-16 DIAGNOSIS — I5020 Unspecified systolic (congestive) heart failure: Secondary | ICD-10-CM | POA: Diagnosis not present

## 2022-07-16 DIAGNOSIS — I4811 Longstanding persistent atrial fibrillation: Secondary | ICD-10-CM | POA: Diagnosis not present

## 2022-07-16 DIAGNOSIS — E785 Hyperlipidemia, unspecified: Secondary | ICD-10-CM | POA: Diagnosis not present

## 2022-07-16 DIAGNOSIS — I2510 Atherosclerotic heart disease of native coronary artery without angina pectoris: Secondary | ICD-10-CM | POA: Diagnosis not present

## 2022-09-19 DIAGNOSIS — I480 Paroxysmal atrial fibrillation: Secondary | ICD-10-CM | POA: Diagnosis not present

## 2022-09-19 DIAGNOSIS — I5023 Acute on chronic systolic (congestive) heart failure: Secondary | ICD-10-CM | POA: Diagnosis not present

## 2022-09-19 DIAGNOSIS — E038 Other specified hypothyroidism: Secondary | ICD-10-CM | POA: Diagnosis not present

## 2022-09-19 DIAGNOSIS — E1169 Type 2 diabetes mellitus with other specified complication: Secondary | ICD-10-CM | POA: Diagnosis not present

## 2022-09-19 DIAGNOSIS — I10 Essential (primary) hypertension: Secondary | ICD-10-CM | POA: Diagnosis not present

## 2022-09-19 DIAGNOSIS — I5022 Chronic systolic (congestive) heart failure: Secondary | ICD-10-CM | POA: Diagnosis not present

## 2022-09-19 DIAGNOSIS — Z0000 Encounter for general adult medical examination without abnormal findings: Secondary | ICD-10-CM | POA: Diagnosis not present

## 2022-09-27 DIAGNOSIS — I10 Essential (primary) hypertension: Secondary | ICD-10-CM | POA: Diagnosis not present

## 2022-09-27 DIAGNOSIS — I5023 Acute on chronic systolic (congestive) heart failure: Secondary | ICD-10-CM | POA: Diagnosis not present

## 2022-09-27 DIAGNOSIS — E1169 Type 2 diabetes mellitus with other specified complication: Secondary | ICD-10-CM | POA: Diagnosis not present

## 2022-09-27 DIAGNOSIS — Z0000 Encounter for general adult medical examination without abnormal findings: Secondary | ICD-10-CM | POA: Diagnosis not present

## 2022-09-27 DIAGNOSIS — I480 Paroxysmal atrial fibrillation: Secondary | ICD-10-CM | POA: Diagnosis not present

## 2022-09-27 DIAGNOSIS — I5022 Chronic systolic (congestive) heart failure: Secondary | ICD-10-CM | POA: Diagnosis not present

## 2023-01-22 DIAGNOSIS — I5022 Chronic systolic (congestive) heart failure: Secondary | ICD-10-CM | POA: Diagnosis not present

## 2023-01-22 DIAGNOSIS — I480 Paroxysmal atrial fibrillation: Secondary | ICD-10-CM | POA: Diagnosis not present

## 2023-01-22 DIAGNOSIS — E038 Other specified hypothyroidism: Secondary | ICD-10-CM | POA: Diagnosis not present

## 2023-01-22 DIAGNOSIS — E1169 Type 2 diabetes mellitus with other specified complication: Secondary | ICD-10-CM | POA: Diagnosis not present

## 2023-01-22 DIAGNOSIS — Z6824 Body mass index (BMI) 24.0-24.9, adult: Secondary | ICD-10-CM | POA: Diagnosis not present

## 2023-01-22 DIAGNOSIS — I10 Essential (primary) hypertension: Secondary | ICD-10-CM | POA: Diagnosis not present

## 2023-01-22 DIAGNOSIS — Z0000 Encounter for general adult medical examination without abnormal findings: Secondary | ICD-10-CM | POA: Diagnosis not present

## 2023-03-03 DIAGNOSIS — H524 Presbyopia: Secondary | ICD-10-CM | POA: Diagnosis not present

## 2023-03-03 DIAGNOSIS — H43811 Vitreous degeneration, right eye: Secondary | ICD-10-CM | POA: Diagnosis not present

## 2023-05-14 DIAGNOSIS — Z6824 Body mass index (BMI) 24.0-24.9, adult: Secondary | ICD-10-CM | POA: Diagnosis not present

## 2023-05-14 DIAGNOSIS — E038 Other specified hypothyroidism: Secondary | ICD-10-CM | POA: Diagnosis not present

## 2023-05-14 DIAGNOSIS — I480 Paroxysmal atrial fibrillation: Secondary | ICD-10-CM | POA: Diagnosis not present

## 2023-05-14 DIAGNOSIS — Z0000 Encounter for general adult medical examination without abnormal findings: Secondary | ICD-10-CM | POA: Diagnosis not present

## 2023-05-14 DIAGNOSIS — E1169 Type 2 diabetes mellitus with other specified complication: Secondary | ICD-10-CM | POA: Diagnosis not present

## 2023-05-14 DIAGNOSIS — I5022 Chronic systolic (congestive) heart failure: Secondary | ICD-10-CM | POA: Diagnosis not present

## 2023-05-14 DIAGNOSIS — I10 Essential (primary) hypertension: Secondary | ICD-10-CM | POA: Diagnosis not present

## 2023-05-30 DIAGNOSIS — I10 Essential (primary) hypertension: Secondary | ICD-10-CM | POA: Diagnosis not present

## 2023-05-30 DIAGNOSIS — E1169 Type 2 diabetes mellitus with other specified complication: Secondary | ICD-10-CM | POA: Diagnosis not present

## 2023-05-30 DIAGNOSIS — E038 Other specified hypothyroidism: Secondary | ICD-10-CM | POA: Diagnosis not present

## 2023-05-30 DIAGNOSIS — I509 Heart failure, unspecified: Secondary | ICD-10-CM | POA: Diagnosis not present

## 2023-05-30 DIAGNOSIS — I5022 Chronic systolic (congestive) heart failure: Secondary | ICD-10-CM | POA: Diagnosis not present

## 2023-05-30 DIAGNOSIS — I480 Paroxysmal atrial fibrillation: Secondary | ICD-10-CM | POA: Diagnosis not present

## 2023-09-10 DIAGNOSIS — I480 Paroxysmal atrial fibrillation: Secondary | ICD-10-CM | POA: Diagnosis not present

## 2023-09-10 DIAGNOSIS — E1169 Type 2 diabetes mellitus with other specified complication: Secondary | ICD-10-CM | POA: Diagnosis not present

## 2023-09-10 DIAGNOSIS — E038 Other specified hypothyroidism: Secondary | ICD-10-CM | POA: Diagnosis not present

## 2023-09-10 DIAGNOSIS — I5022 Chronic systolic (congestive) heart failure: Secondary | ICD-10-CM | POA: Diagnosis not present

## 2023-09-10 DIAGNOSIS — Z6824 Body mass index (BMI) 24.0-24.9, adult: Secondary | ICD-10-CM | POA: Diagnosis not present

## 2023-09-10 DIAGNOSIS — I10 Essential (primary) hypertension: Secondary | ICD-10-CM | POA: Diagnosis not present

## 2024-01-27 DIAGNOSIS — I10 Essential (primary) hypertension: Secondary | ICD-10-CM | POA: Diagnosis not present

## 2024-01-27 DIAGNOSIS — Z0000 Encounter for general adult medical examination without abnormal findings: Secondary | ICD-10-CM | POA: Diagnosis not present

## 2024-01-27 DIAGNOSIS — I5022 Chronic systolic (congestive) heart failure: Secondary | ICD-10-CM | POA: Diagnosis not present

## 2024-01-27 DIAGNOSIS — Z6824 Body mass index (BMI) 24.0-24.9, adult: Secondary | ICD-10-CM | POA: Diagnosis not present

## 2024-01-27 DIAGNOSIS — I480 Paroxysmal atrial fibrillation: Secondary | ICD-10-CM | POA: Diagnosis not present

## 2024-01-27 DIAGNOSIS — E1169 Type 2 diabetes mellitus with other specified complication: Secondary | ICD-10-CM | POA: Diagnosis not present

## 2024-01-27 DIAGNOSIS — E038 Other specified hypothyroidism: Secondary | ICD-10-CM | POA: Diagnosis not present

## 2024-02-19 DIAGNOSIS — I480 Paroxysmal atrial fibrillation: Secondary | ICD-10-CM | POA: Diagnosis not present

## 2024-02-19 DIAGNOSIS — E1169 Type 2 diabetes mellitus with other specified complication: Secondary | ICD-10-CM | POA: Diagnosis not present

## 2024-02-19 DIAGNOSIS — I10 Essential (primary) hypertension: Secondary | ICD-10-CM | POA: Diagnosis not present

## 2024-02-19 DIAGNOSIS — I5022 Chronic systolic (congestive) heart failure: Secondary | ICD-10-CM | POA: Diagnosis not present

## 2024-02-19 DIAGNOSIS — E038 Other specified hypothyroidism: Secondary | ICD-10-CM | POA: Diagnosis not present

## 2024-02-26 DIAGNOSIS — H5203 Hypermetropia, bilateral: Secondary | ICD-10-CM | POA: Diagnosis not present

## 2024-02-26 DIAGNOSIS — H43811 Vitreous degeneration, right eye: Secondary | ICD-10-CM | POA: Diagnosis not present

## 2024-03-25 DIAGNOSIS — M810 Age-related osteoporosis without current pathological fracture: Secondary | ICD-10-CM | POA: Diagnosis not present

## 2024-03-25 DIAGNOSIS — M8588 Other specified disorders of bone density and structure, other site: Secondary | ICD-10-CM | POA: Diagnosis not present

## 2024-03-25 DIAGNOSIS — Z780 Asymptomatic menopausal state: Secondary | ICD-10-CM | POA: Diagnosis not present

## 2024-03-25 DIAGNOSIS — Z13820 Encounter for screening for osteoporosis: Secondary | ICD-10-CM | POA: Diagnosis not present

## 2024-03-25 DIAGNOSIS — M8580 Other specified disorders of bone density and structure, unspecified site: Secondary | ICD-10-CM | POA: Diagnosis not present

## 2024-04-28 DIAGNOSIS — Z6824 Body mass index (BMI) 24.0-24.9, adult: Secondary | ICD-10-CM | POA: Diagnosis not present

## 2024-04-28 DIAGNOSIS — E038 Other specified hypothyroidism: Secondary | ICD-10-CM | POA: Diagnosis not present

## 2024-04-28 DIAGNOSIS — I5022 Chronic systolic (congestive) heart failure: Secondary | ICD-10-CM | POA: Diagnosis not present

## 2024-04-28 DIAGNOSIS — I480 Paroxysmal atrial fibrillation: Secondary | ICD-10-CM | POA: Diagnosis not present

## 2024-04-28 DIAGNOSIS — I10 Essential (primary) hypertension: Secondary | ICD-10-CM | POA: Diagnosis not present

## 2024-04-28 DIAGNOSIS — E1122 Type 2 diabetes mellitus with diabetic chronic kidney disease: Secondary | ICD-10-CM | POA: Diagnosis not present

## 2024-04-28 DIAGNOSIS — N182 Chronic kidney disease, stage 2 (mild): Secondary | ICD-10-CM | POA: Diagnosis not present

## 2024-04-28 DIAGNOSIS — Z0000 Encounter for general adult medical examination without abnormal findings: Secondary | ICD-10-CM | POA: Diagnosis not present

## 2024-04-29 ENCOUNTER — Other Ambulatory Visit (HOSPITAL_COMMUNITY): Payer: Self-pay | Admitting: Internal Medicine

## 2024-04-29 DIAGNOSIS — Z1231 Encounter for screening mammogram for malignant neoplasm of breast: Secondary | ICD-10-CM

## 2024-04-30 DIAGNOSIS — Z0000 Encounter for general adult medical examination without abnormal findings: Secondary | ICD-10-CM | POA: Diagnosis not present

## 2024-08-23 DIAGNOSIS — E038 Other specified hypothyroidism: Secondary | ICD-10-CM | POA: Diagnosis not present

## 2024-08-23 DIAGNOSIS — E1122 Type 2 diabetes mellitus with diabetic chronic kidney disease: Secondary | ICD-10-CM | POA: Diagnosis not present

## 2024-08-23 DIAGNOSIS — Z6824 Body mass index (BMI) 24.0-24.9, adult: Secondary | ICD-10-CM | POA: Diagnosis not present

## 2024-08-23 DIAGNOSIS — N182 Chronic kidney disease, stage 2 (mild): Secondary | ICD-10-CM | POA: Diagnosis not present

## 2024-08-23 DIAGNOSIS — I10 Essential (primary) hypertension: Secondary | ICD-10-CM | POA: Diagnosis not present

## 2024-08-23 DIAGNOSIS — I480 Paroxysmal atrial fibrillation: Secondary | ICD-10-CM | POA: Diagnosis not present

## 2024-08-23 DIAGNOSIS — I5022 Chronic systolic (congestive) heart failure: Secondary | ICD-10-CM | POA: Diagnosis not present

## 2024-08-27 DIAGNOSIS — I10 Essential (primary) hypertension: Secondary | ICD-10-CM | POA: Diagnosis not present

## 2024-08-27 DIAGNOSIS — I480 Paroxysmal atrial fibrillation: Secondary | ICD-10-CM | POA: Diagnosis not present

## 2024-08-27 DIAGNOSIS — I5022 Chronic systolic (congestive) heart failure: Secondary | ICD-10-CM | POA: Diagnosis not present

## 2024-08-27 DIAGNOSIS — E038 Other specified hypothyroidism: Secondary | ICD-10-CM | POA: Diagnosis not present
